# Patient Record
Sex: Female | Born: 1955 | Race: White | Hispanic: No | Marital: Married | Smoking: Former smoker
Health system: Southern US, Academic
[De-identification: ages and names within clinical notes are randomized; demographics above are authoritative.]

## PROBLEM LIST (undated history)

## (undated) DIAGNOSIS — E079 Disorder of thyroid, unspecified: Secondary | ICD-10-CM

## (undated) DIAGNOSIS — C189 Malignant neoplasm of colon, unspecified: Secondary | ICD-10-CM

## (undated) HISTORY — PX: HX COLECTOMY: SHX59

## (undated) HISTORY — PX: HX GALL BLADDER SURGERY/CHOLE: SHX55

## (undated) HISTORY — PX: HX HYSTERECTOMY: SHX81

## (undated) HISTORY — PX: HX TONSILLECTOMY: SHX27

## (undated) HISTORY — PX: LIVER RESECTION: SHX1977

## (undated) HISTORY — PX: HX APPENDECTOMY: SHX54

---

## 2011-08-30 IMAGING — CT CT ABDOMEN AND PELVIS WITH CONTRAST
2 of 9 series · 12 of 46 positions shown, 18 images · IV contrast (CE)
Comparison: none

[place], [place] [id number]                 FAX:  [phone]
CT ABDOMEN AND PELVIS WITH CONTRAST, 08/30/11:
Comparison made to multiple exams dated 03/21/11.

[Series 3: body 2.0 abd portal ce · axial · portal-venous · 0.76mm/px · z∈[+1388,+1778]mm · 10 of 226 slices shown, 15 images]
[im 16/226  soft-tissue]
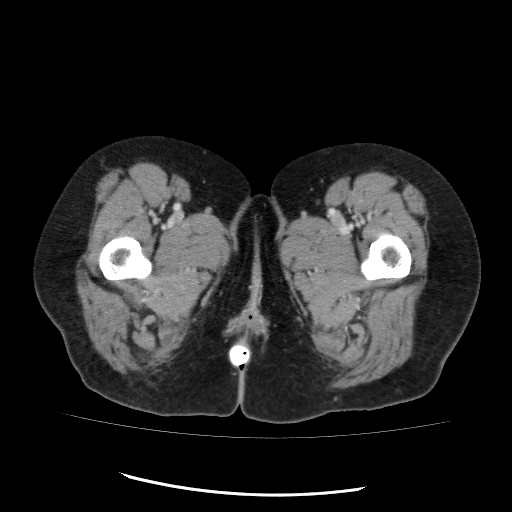
[im 16/226  bone]
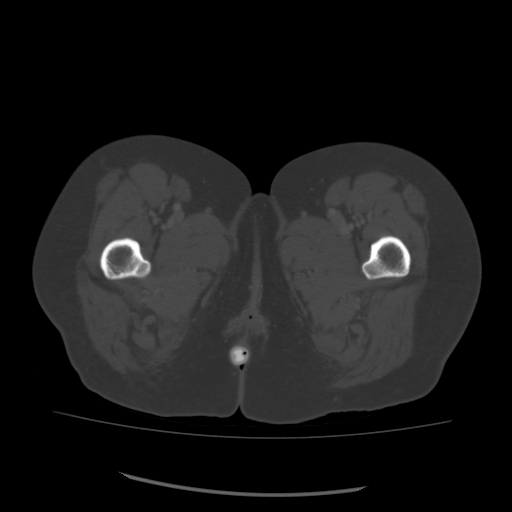
[im 46/226  soft-tissue]
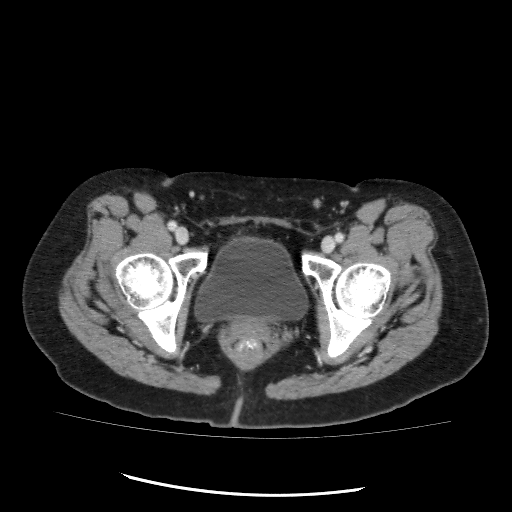
[im 61/226  soft-tissue]
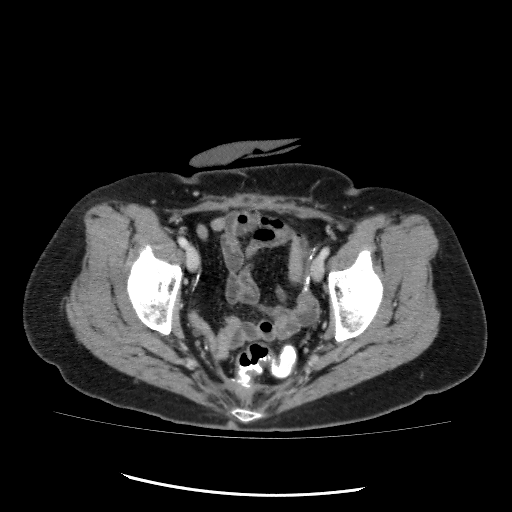
[im 91/226  soft-tissue]
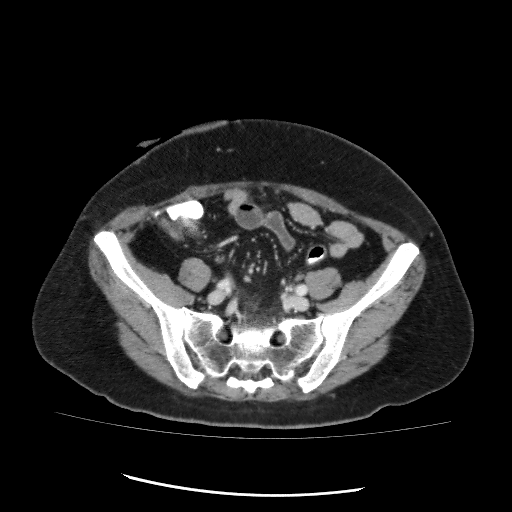
[im 121/226  soft-tissue]
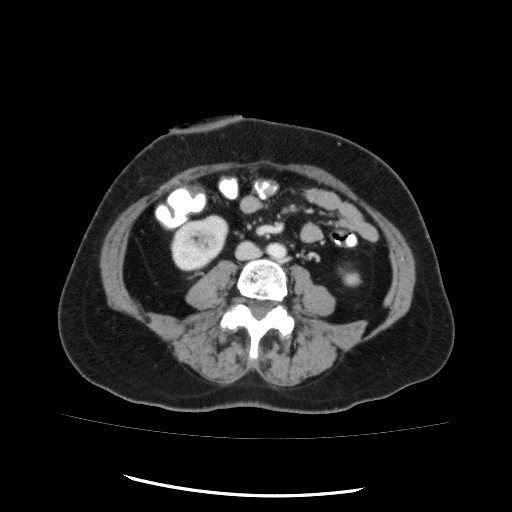
[im 136/226  soft-tissue]
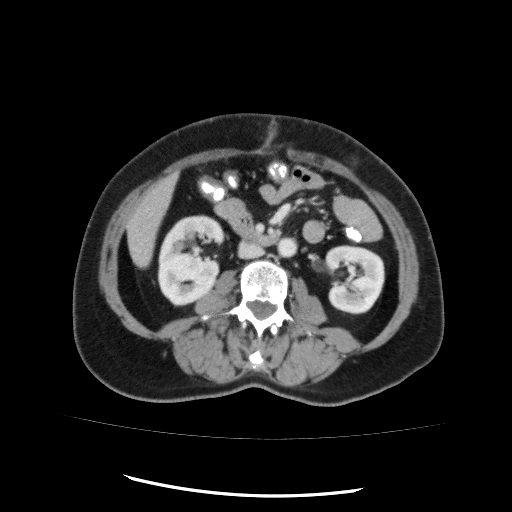
[im 166/226  soft-tissue]
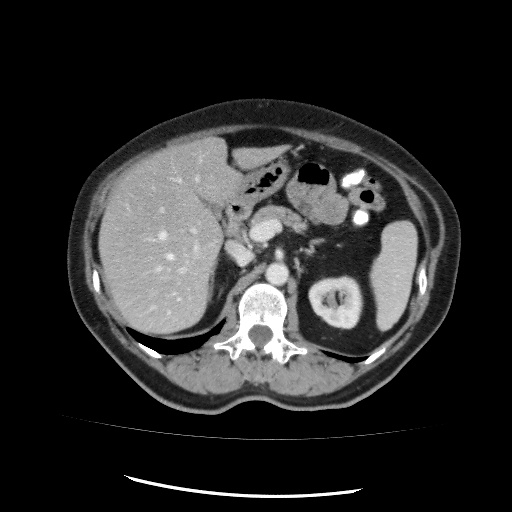
[im 166/226  lung]
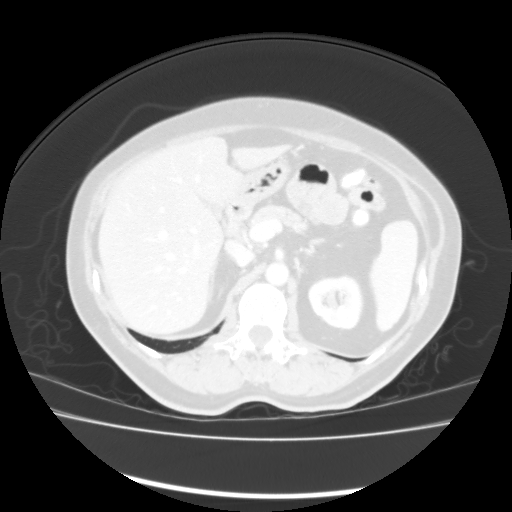
[im 181/226  soft-tissue]
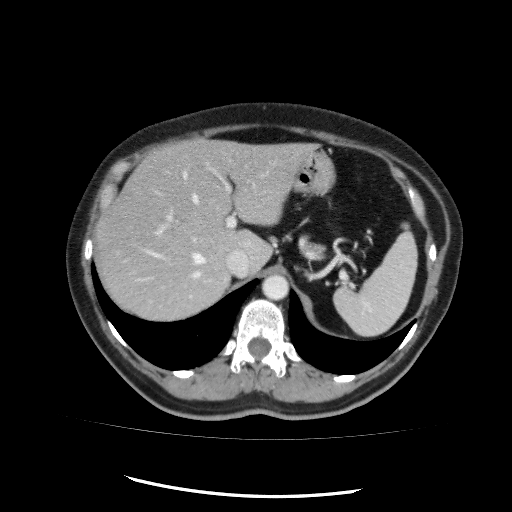
[im 181/226  lung]
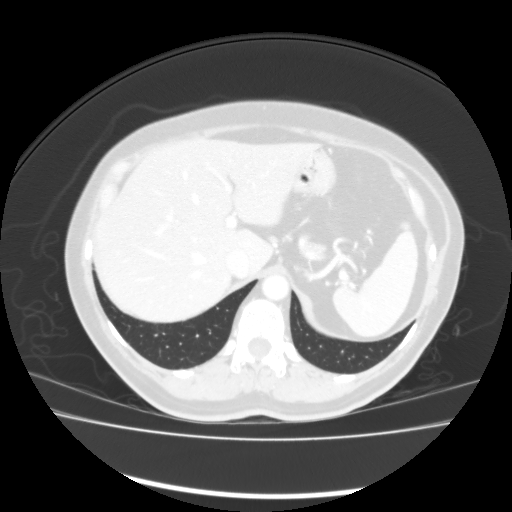
[im 196/226  lung]
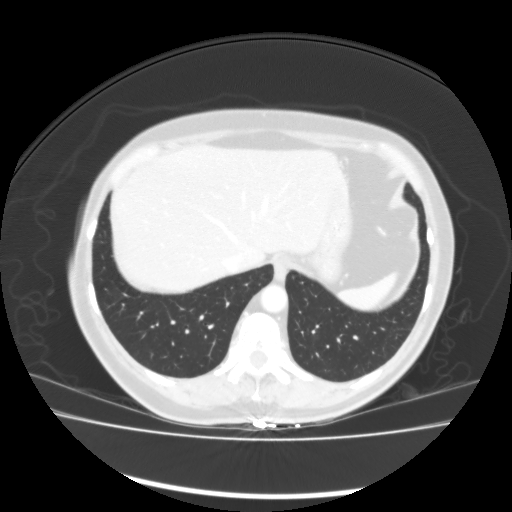
[im 211/226  soft-tissue]
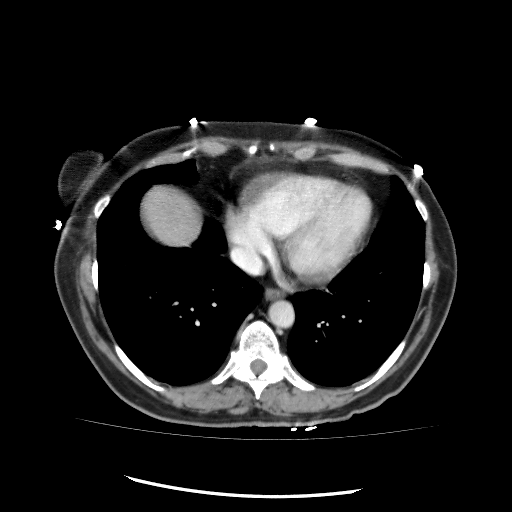
[im 211/226  lung]
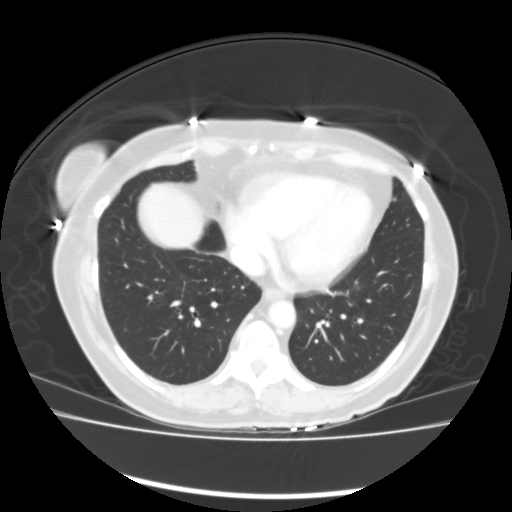
[im 211/226  bone]
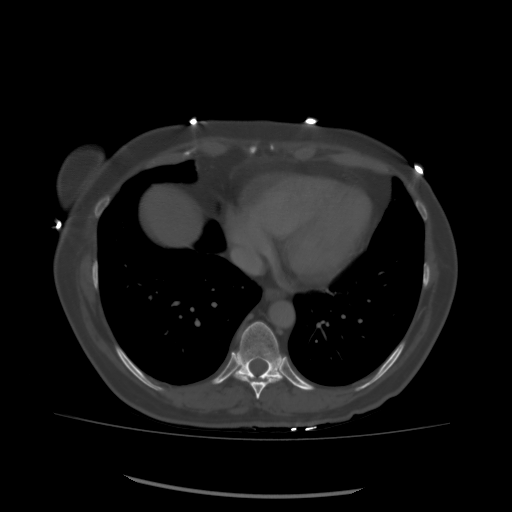

[Series 5: body 3.0 abd portal/coronal ce · coronal · portal-venous · 0.76mm/px · 2 of 89 slices shown, 3 images]
[im 30/89  soft-tissue]
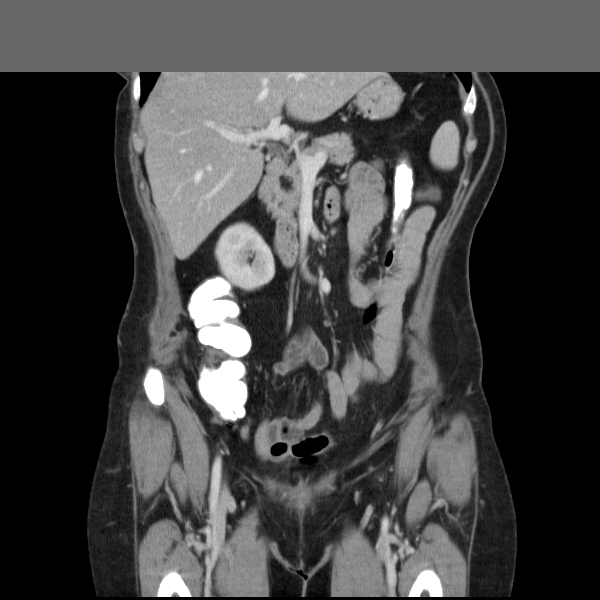
[im 30/89  bone]
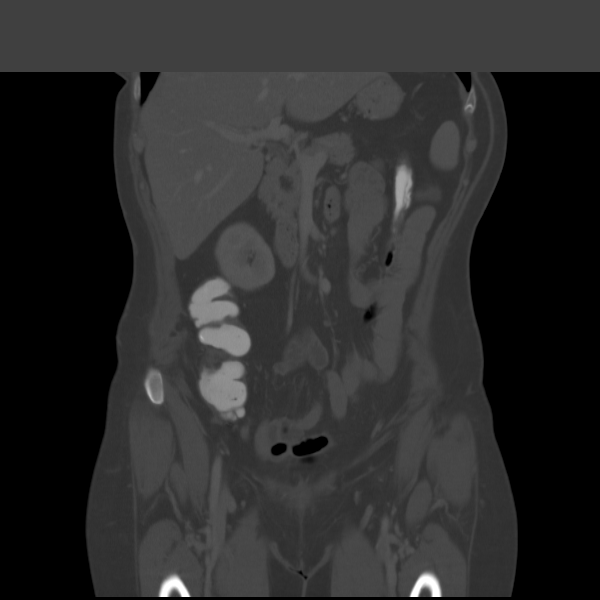
[im 59/89  soft-tissue]
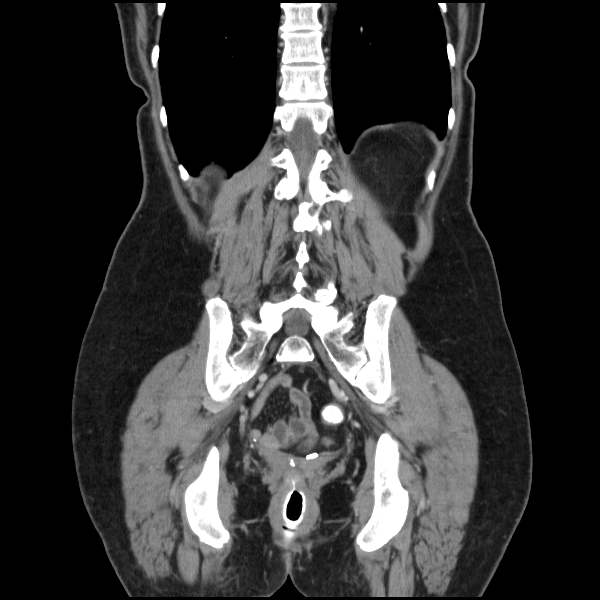

[12 of 46 positions shown; findings below may reference images not displayed]

FINDINGS: Postop changes are seen.  There is an ostomy in the right lower
quadrant.  Contrast was infused into the colon via the rectum.  No evidence
of extravasation or complication seen on this exam.  Postoperative changes
are identified.  The edema previously seen within the presacral space is
once again noted.  However, this is dramatically improved since the prior
exam.  There are no extraluminal fluid collections seen at this time.  The
lung bases are clear.  The liver, spleen, pancreas, adrenals and kidneys
are unremarkable with a nonobstructing left renal calculus seen measuring
approximately 2 x 2 mm.  Otherwise, no acute abnormalities are seen on this
exam.  No recurring abscess seen.
IMPRESSION: 1.   Postop changes.
2.   No recurrent abscess or abnormal fluid collection seen.  The
inflammatory changes and scarring seen in the presacral space has improved
since the examination of 05/14/11.
3.   Nonobstructing left renal calculus.

## 2017-07-01 ENCOUNTER — Emergency Department
Admission: EM | Admit: 2017-07-01 | Discharge: 2017-07-01 | Disposition: A | Discharge status: Home / Self Care | Payer: Medicare Other | Attending: Emergency Medicine | Admitting: Emergency Medicine

## 2017-07-01 ENCOUNTER — Other Ambulatory Visit: Payer: Self-pay

## 2017-07-01 DIAGNOSIS — Z87891 Personal history of nicotine dependence: Secondary | ICD-10-CM | POA: Insufficient documentation

## 2017-07-01 DIAGNOSIS — Z90710 Acquired absence of both cervix and uterus: Secondary | ICD-10-CM | POA: Insufficient documentation

## 2017-07-01 DIAGNOSIS — E039 Hypothyroidism, unspecified: Secondary | ICD-10-CM | POA: Insufficient documentation

## 2017-07-01 DIAGNOSIS — Z85038 Personal history of other malignant neoplasm of large intestine: Secondary | ICD-10-CM | POA: Insufficient documentation

## 2017-07-01 DIAGNOSIS — Z9049 Acquired absence of other specified parts of digestive tract: Secondary | ICD-10-CM | POA: Insufficient documentation

## 2017-07-01 DIAGNOSIS — H6692 Otitis media, unspecified, left ear: Principal | ICD-10-CM | POA: Insufficient documentation

## 2017-07-01 DIAGNOSIS — R51 Headache: Secondary | ICD-10-CM | POA: Insufficient documentation

## 2017-07-01 DIAGNOSIS — Z79899 Other long term (current) drug therapy: Secondary | ICD-10-CM | POA: Insufficient documentation

## 2017-07-01 DIAGNOSIS — R42 Dizziness and giddiness: Secondary | ICD-10-CM | POA: Insufficient documentation

## 2017-07-01 HISTORY — DX: Disorder of thyroid, unspecified: E07.9

## 2017-07-01 HISTORY — DX: Malignant neoplasm of colon, unspecified (CMS HCC): C18.9

## 2017-07-01 LAB — RAPID MOLECULAR INFLUENZA A/B (EAST ONLY): INFLUENZA A RNA: NEGATIVE

## 2017-07-01 MED ORDER — ONDANSETRON 4 MG DISINTEGRATING TABLET
4.0000 mg | ORAL_TABLET | ORAL | Status: AC
Start: 2017-07-01 — End: 2017-07-01
  Administered 2017-07-01: 4 mg via ORAL
  Filled 2017-07-01: qty 1

## 2017-07-01 MED ORDER — AMOXICILLIN 500 MG CAPSULE
500.00 mg | ORAL_CAPSULE | Freq: Three times a day (TID) | ORAL | 0 refills | Status: AC
Start: 2017-07-01 — End: ?

## 2017-07-01 MED ORDER — ONDANSETRON 4 MG DISINTEGRATING TABLET
4.0000 mg | ORAL_TABLET | Freq: Three times a day (TID) | ORAL | 0 refills | Status: AC | PRN
Start: 2017-07-01 — End: ?

## 2017-07-01 NOTE — ED Triage Notes (Signed)
Pt ambulatory to ER reporting frontal h/a and sore throat x 1 day. Pt is also reporting that she is having nausea, L ear pain and lightheadedness Pt is alert and awake , skin warm and dry

## 2017-07-01 NOTE — ED Provider Notes (Signed)
[facility]  Emergency Department     HISTORY OF PRESENT ILLNESS     Date:  07/01/2017  Patient's Name:  Natasha Rodriguez  Date of Birth:  [birth year]      Headache   Associated symptoms: dizziness, ear pain, nausea and sore throat    Associated symptoms: no abdominal pain, no cough, no diarrhea, no fever and no vomiting    Sore Throat   Associated symptoms: ear pain, headaches, nausea and sore throat    Associated symptoms: no abdominal pain, no chest pain, no cough, no diarrhea, no fever and no vomiting    Ear Pain   Associated symptoms: headaches and sore throat    Associated symptoms: no abdominal pain, no cough, no diarrhea, no fever and no vomiting    62 y/o female presents to the ED complaining of sore throat.  Pt is visiting from out of town. Yesterday patient states that she had onset of sore throat. Today patient is experiencing headache, nausea, dizziness, and left ear pain as well. Symptoms have been constant since onset. Denies vomiting or fever. Pt did not get flu shot this year. Pt says she had contact with someone who had the flu recently.     Review of Systems     Review of Systems   Constitutional: Negative for chills and fever.   HENT: Positive for ear pain and sore throat.    Respiratory: Negative for cough.    Cardiovascular: Negative for chest pain.   Gastrointestinal: Positive for nausea. Negative for abdominal pain, constipation, diarrhea and vomiting.   Neurological: Positive for dizziness and headaches. Negative for syncope.   All other systems reviewed and are negative.      Previous History     Past Medical History:  Past Medical History:   Diagnosis Date   . Colon cancer (CMS HCC)    . Thyroid disease     hypothyroidism        Past Surgical History:  Past Surgical History:   Procedure Laterality Date   . Hx appendectomy     . Hx cesarean section     . Hx cholecystectomy     . Hx colectomy     . Hx hysterectomy     . Hx tonsillectomy     . Liver resection               Social History:  Social History     Tobacco Use   . Smoking status: Former Research scientist (life sciences)   . Smokeless tobacco: Never Used   Substance Use Topics   . Alcohol use: Not Currently   . Drug use: Never     Social History     Substance and Sexual Activity   Drug Use Never       Family History:  No family history on file.    Medication History:  Current Outpatient Medications   Medication Sig   . amoxicillin (AMOXIL) 500 mg Oral Capsule Take 1 Cap (500 mg total) by mouth Three times a day   . ondansetron (ZOFRAN ODT) 4 mg Oral Tablet, Rapid Dissolve Take 1 Tab (4 mg total) by mouth Every 8 hours as needed for nausea/vomiting   . thyroid (ARMOUR THYROID) 120 mg Oral Tablet Take 120 mg by mouth Once a day       Allergies:  Allergies   Allergen Reactions   . Adhesive      Rash         Physical  Exam     Vitals:    BP 127/78   Pulse 79   Temp 37 C (98.6 F)   Resp 16   Ht 1.626 m (5\' 4" )   Wt 68 kg (150 lb)   SpO2 96%   BMI 25.75 kg/m         Physical Exam   Nursing note and vitals reviewed.  Constitutional:  Well developed, well nourished.  Awake & alert. No distress.  Head:  Atraumatic.  Normocephalic.    Eyes:  PERRL.  EOMI.  Conjunctivae are not pale.  ENT:  Mucous membranes are moist and intact.  Oropharynx is clear and symmetric.  Patent airway. Post nasal drip. Left TM fullness and erythema.   Neck:  Supple.  Full ROM.  No JVD.  No lymphadenopathy.  Cardiovascular:  Regular rate.  Regular rhythm.  No murmurs, rubs, or gallops.  Distal pulses are 2+ and symmetric.  Pulmonary/Chest:  No evidence of respiratory distress.  Clear to auscultation bilaterally.  No wheezing, rales or rhonchi. Chest non-tender.  Abdominal:  Soft and non-distended.  There is no tenderness.  No rebound, guarding, or rigidity.  No organomegaly.  Good bowel sounds.    Back:  No CVA tenderness. FROM.   Extremities:  No edema.   No cyanosis.  No clubbing.  Full range of motion in all extremities.  No calf tenderness.  Skin:  Skin is warm and  dry.  No diaphoresis. No rash.   Neurological:  Alert, awake, and appropriate.  Normal speech.  Sensation normal. Motor strengths 5/5. CN II-XII intact.   Psychiatric:  Good eye contact.  Normal interaction, affect, and behavior.      Diagnostic Studies/Treatment     Medications:  Medications   ondansetron (ZOFRAN ODT) rapid dissolve tablet (4 mg Oral Given 07/01/17 0952)       This SmartLink is deprecated. Use AVSMEDLIST instead to display the medication list for a patient.    Labs:    Results for orders placed or performed during the hospital encounter of 07/01/17   RAPID MOLECULAR INFLENZA A & B - [facility] ONLY   Result Value Ref Range    INFLUENZA A RNA Negative Negative    INFLUENZA B RNA Negative Negative       Radiology:  None    No orders to display       ECG:  NONE      Procedure     Procedures    Course/Disposition/Plan     Course:    Chart Review:  Chart Review complete.   Initial Evaluation:  0940: Initial evaluation is complete at this time. I discussed with the patient that I would order a flu test to further evaluate. Zofran ordered to treat nausea. I will reevaluate to check the patient's progress after treatment. Patient is agreeable with the treatment plan at this time.  Reevaluation Notes:  9528: On reevaluation updated patient on results of negative flu test. Informed her that I would prescribe an antibiotic to treat her ear infection. Patient verbalized understanding.    Based on my history, physical exam, and diagnostic evaluation, the patient appears to have symptoms consistent with acute otitis media. There is no mastoid tenderness or evidence of spreading infection. There was no trauma to the ear. Pt is tolerating oral food and fluid. Pt is to follow up and have repeat exam with their primary care physician as soon as possible and instructed to return to the emergency department for new complaints  such as severe headache, increased ear pain, difficulty breathing or not tolerating oral  food/fluid.    Patient discharged with prescription for amoxicillin.       Disposition:    Discharged    Condition at Disposition:   Stable      Follow up:   Pcp, No    In 1 week        Clinical Impression:     Encounter Diagnosis   Name Primary?   . Left otitis media, unspecified otitis media type Yes       Future Appointments Scheduled in Epic:  No future appointments.  SCRIBE ATTESTATION   This note is prepared by Gerhard Perches, acting as Scribe for Dr. Polly Cobia    The scribe's documentation has been prepared under my direction and personally reviewed by me in its entirety.  I confirm that the note above accurately reflects all work, treatment, procedures, and medical decision making performed by me, Dr. Polly Cobia

## 2017-07-01 NOTE — ED Nurses Note (Signed)
Care completed and report to [facility]

## 2017-07-01 NOTE — ED Nurses Note (Signed)
Patient discharged home with family.  AVS reviewed with patient/care giver.  A written copy of the AVS and discharge instructions was given to the patient/care giver.  Questions sufficiently answered as needed.  Patient/care giver encouraged to follow up with PCP as indicated.  In the event of an emergency, patient/care giver instructed to call 911 or go to the nearest emergency room.     RX sent electronically

## 2017-07-01 NOTE — ED Nurses Note (Signed)
MD at bedside to assess.

## 2019-01-29 IMAGING — MR MRI ABDOMEN W/WO CONTRAST
16 of 22 series · 30 of 48 positions shown · IV contrast (gadolinium)
Comparison: CT exam the same day and dating back to 09/07/2010. Report only of MR 
exam of 06/07/2018.

MRI ABDOMEN W/WO CONTRAST, 01/29/2019 [DATE]: 
CLINICAL INDICATION: Colorectal cancer. Metastases to liver 3 years ago. Liver 
resection.
TECHNIQUE: Multiplanar, multi sequence images were performed of the abdomen with 
and with gadolinium contrast material. 7.5 cc of Gadavist were administered per 
protocol. The patient's eGFR was calculated to be 67 using the i-STAT device.

[Series 101: survey · axial · 15.0mm · 1.76mm/px · 1 of 11 slices shown]
[im 1/11]
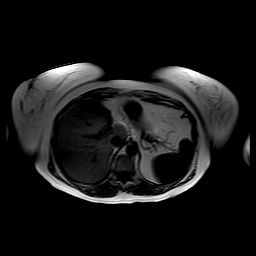

[Series 201: survey navi · axial · 15.0mm · 1.76mm/px · 1 of 11 slices shown]
[im 1/11]
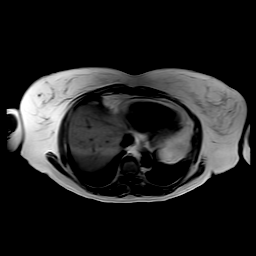

[Series 302: sout of phase · axial · 6.0mm · 1.19mm/px · 1 of 32 slices shown]
[im 1/32]
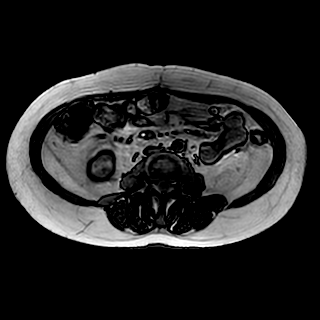

[Series 303: sin phase · axial · 6.0mm · 1.19mm/px · 1 of 32 slices shown]
[im 1/32]
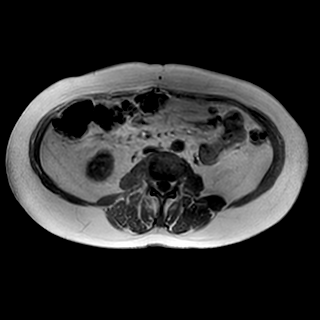

[Series 401: (id)* · coronal · 5.0mm · 0.72mm/px · 1 of 34 slices shown]
[im 1/34]
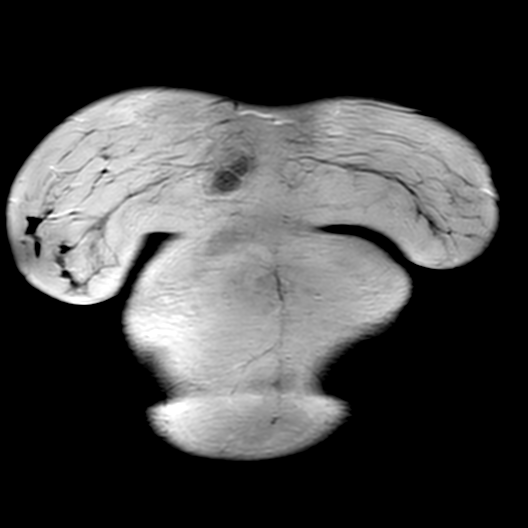

[Series 501: dwi_3b_nav · axial · 5.0mm · 1.56mm/px · 1 of 90 slices shown]
[im 1/90]
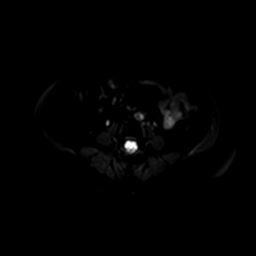

[Series 502: dadc 600 · axial · 5.0mm · 1.56mm/px · 1 of 45 slices shown]
[im 1/45]
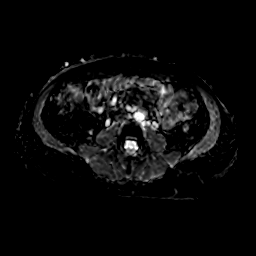

[Series 503: sb0 · axial · 5.0mm · 1.56mm/px · 1 of 45 slices shown]
[im 1/45]
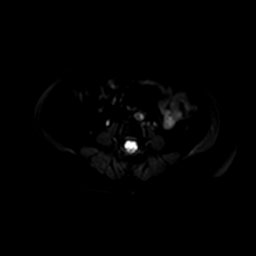

[Series 504: (id) · axial · 5.0mm · 1.56mm/px · 1 of 45 slices shown]
[im 1/45]
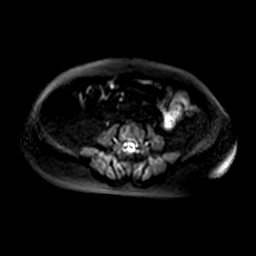

[Series 601: DIXON · axial · 3.5mm · 0.88mm/px · z∈[-146,+71]mm · 8 of 625 slices shown (1 of 6)]
[im 1/625]
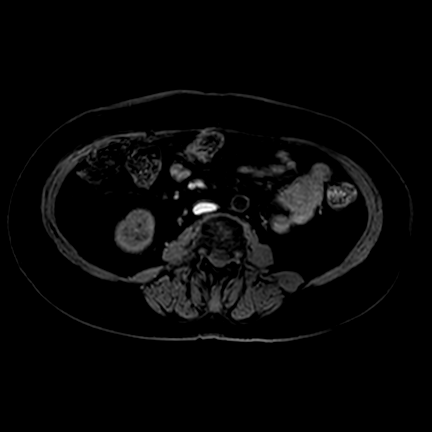
[im 114/625]
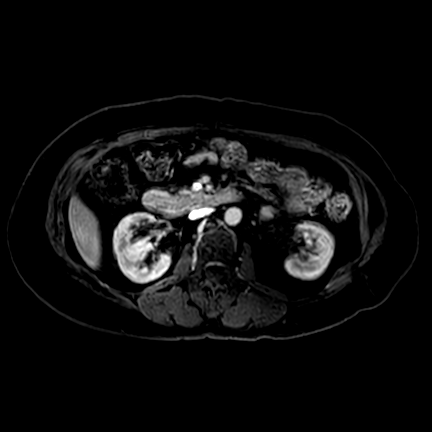
[im 171/625]
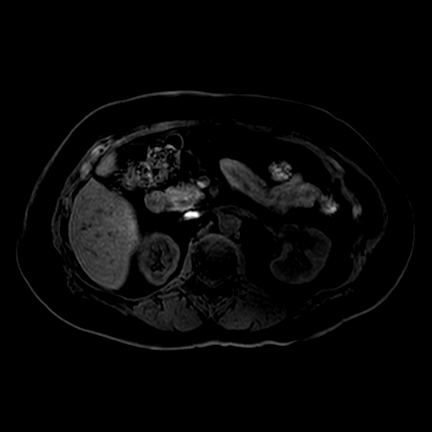
[im 284/625]
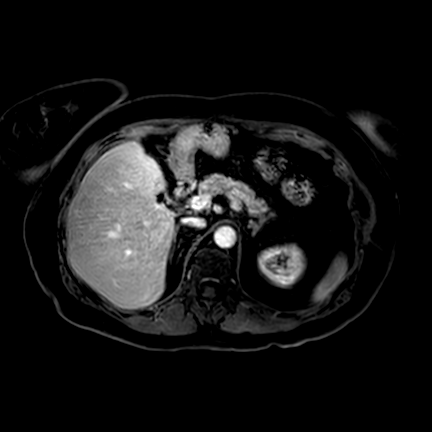
[im 341/625]
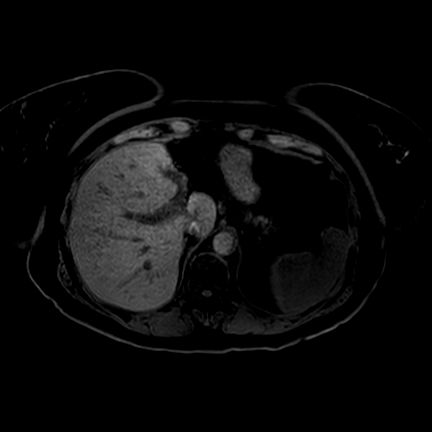
[im 454/625]
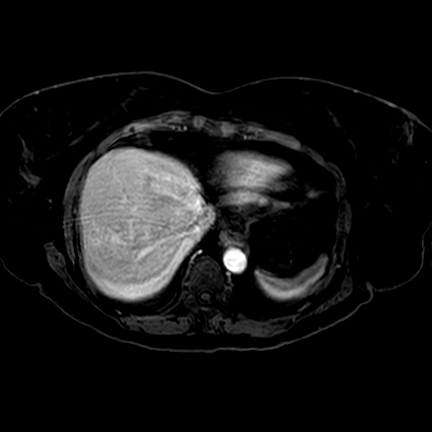
[im 511/625]
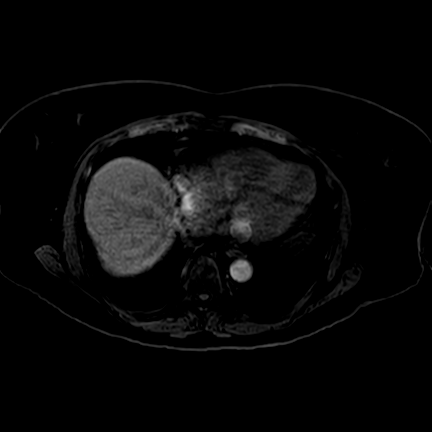
[im 625/625]
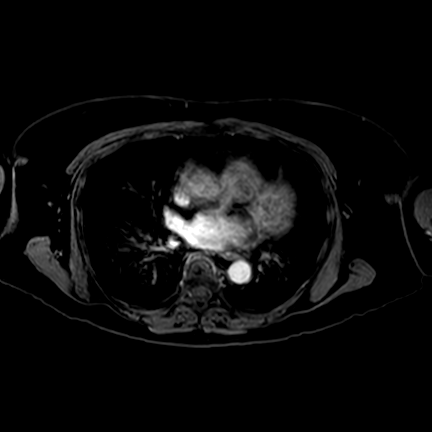

[Series 602: DIXON · axial · 3.5mm · 0.88mm/px · z∈[-146,+71]mm · 2 of 125 slices shown (2 of 6)]
[im 1/125]
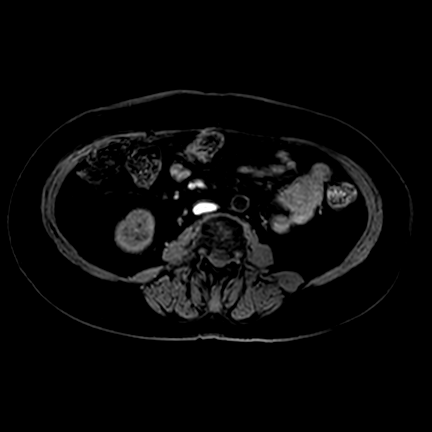
[im 125/125]
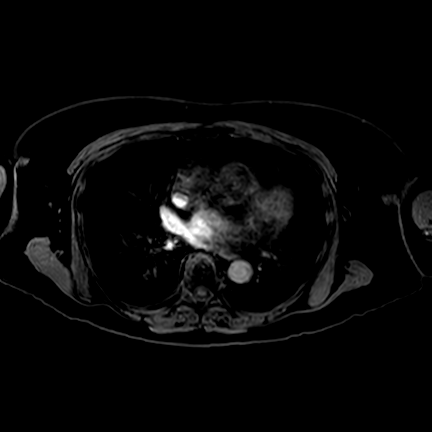

[Series 603: DIXON · axial · 3.5mm · 0.88mm/px · z∈[-146,+71]mm · 2 of 125 slices shown (3 of 6)]
[im 1/125]
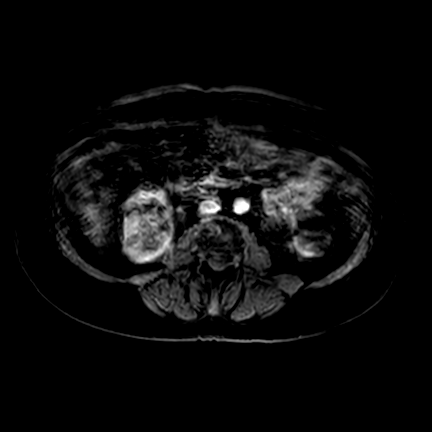
[im 125/125]
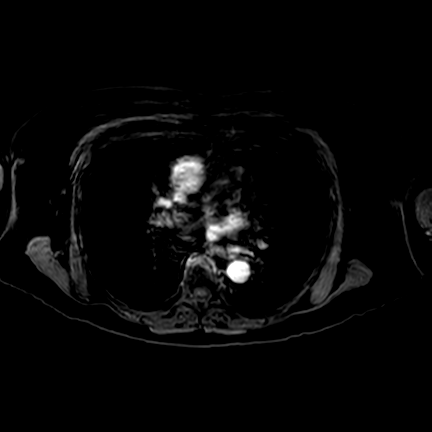

[Series 604: DIXON · axial · 3.5mm · 0.88mm/px · z∈[-146,+71]mm · 2 of 125 slices shown (4 of 6)]
[im 1/125]
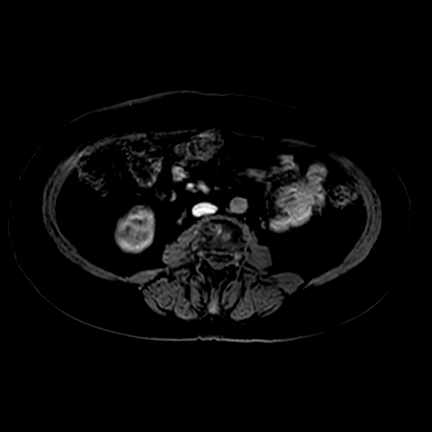
[im 125/125]
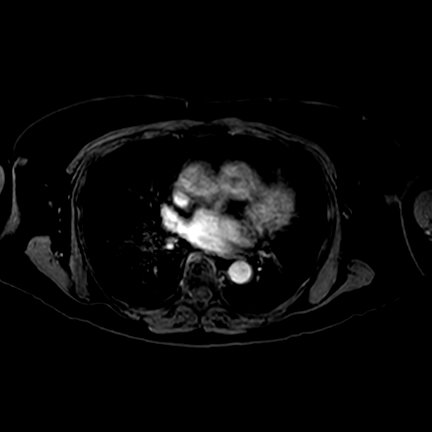

[Series 605: DIXON · axial · 3.5mm · 0.88mm/px · z∈[-146,+71]mm · 2 of 125 slices shown (5 of 6)]
[im 1/125]
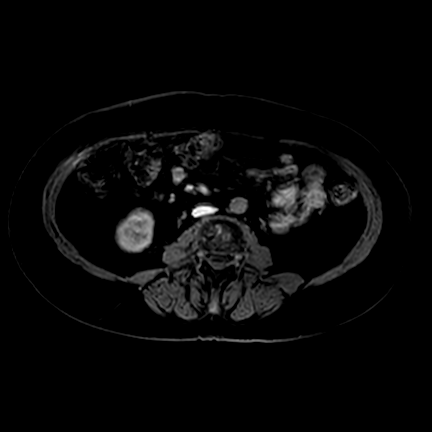
[im 125/125]
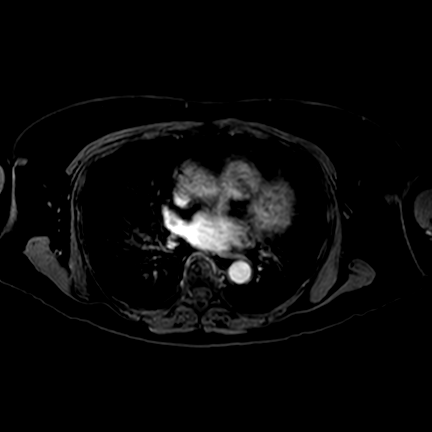

[Series 606: DIXON · axial · 3.5mm · 0.88mm/px · z∈[-146,+71]mm · 2 of 125 slices shown (6 of 6)]
[im 1/125]
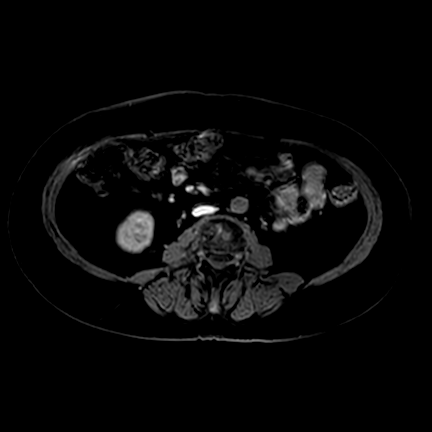
[im 125/125]
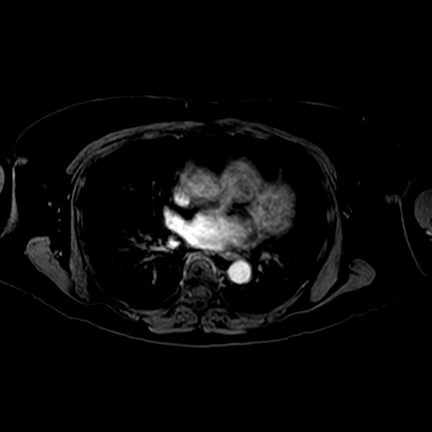

[Series 607: DIXON post-contrast · axial · 3.5mm · 0.88mm/px · z∈[-146,+71]mm · 3 of 125 slices shown]
[im 1/125]
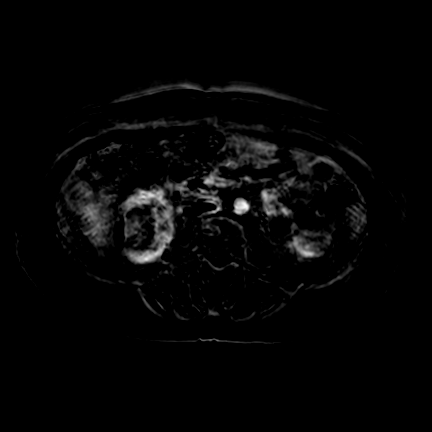
[im 63/125]
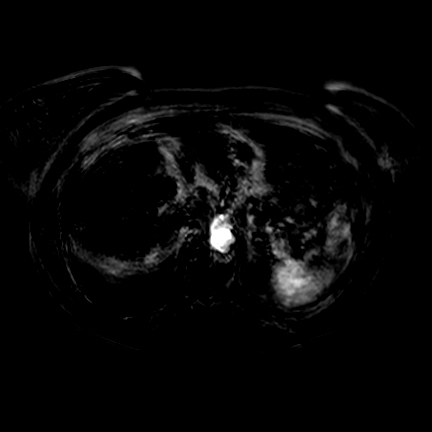
[im 125/125]
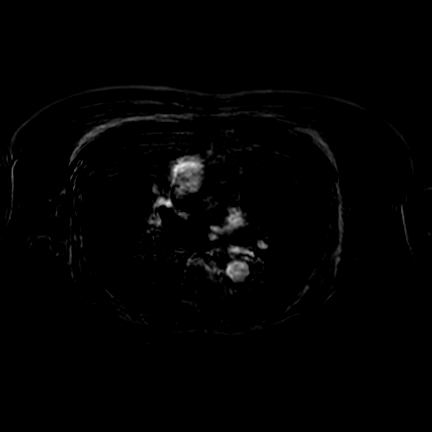

[30 of 48 positions shown; findings below may reference images not displayed]

FINDINGS: Old postsurgical changes of left lateral segmentectomy. Mild signal dropout of 
the liver compatible with hepatic steatosis on the out of phase series. No area 
of abnormal signal intensity within the liver to suggest residual/recurrent 
disease. No area of abnormal restricted diffusion or enhancement. Patient motion 
obscures some detail, particularly on the post gadolinium enhanced series. 
There is a focal area of fat signal intensity on all pulse sequences within the 
pancreatic head measuring 1.7 cm compatible with a lipoma. There is a lipoma 
within the left lateral wall musculature measuring 8.9 cm AP by 2.3 cm TR. Lower 
abdominal wall ventral hernia without bowel incarceration. The gallbladder is 
surgically absent. The pancreas, spleen, adrenal glands, kidneys and bowel are 
negative.
IMPRESSION: 1.  Hepatic steatosis. 
2.  No evidence of neoplasm recurrence or metastatic disease.

## 2019-01-29 IMAGING — CT CT CHEST/ABDOMEN/PELVIS WITH CONTRAST
2 of 7 series · 13 of 46 positions shown, 18 images · IV contrast (ISOVUE 300)
Comparison: There are no prior exam(s) available for comparison within the 
past 12 months;

CT CHEST/ABDOMEN/PELVIS WITH CONTRAST, 01/29/2019 [DATE]: 
CLINICAL INDICATION: Colon carcinoma. Six-month follow-up. Previous appendectomy 
and hysterectomy. Ileostomy. History of liver resection. 
A search for DICOM formatted images was conducted for prior CT imaging studies 
completed at a non-affiliated media free facility.
TECHNIQUE: The chest, abdomen and pelvis were scanned from base of neck through 
the pubic rami with 600cc of Isovue 300 injected intravenously on a high 
resolution low dose CT scanner.  Routine MPR and MIP 3D renderings were 
reconstructed on an independent workstation with concurrent physician 
supervision.

[Series 7: coronal · coronal · 0.62mm/px · 3 of 121 slices shown]
[im 31/121  soft-tissue]
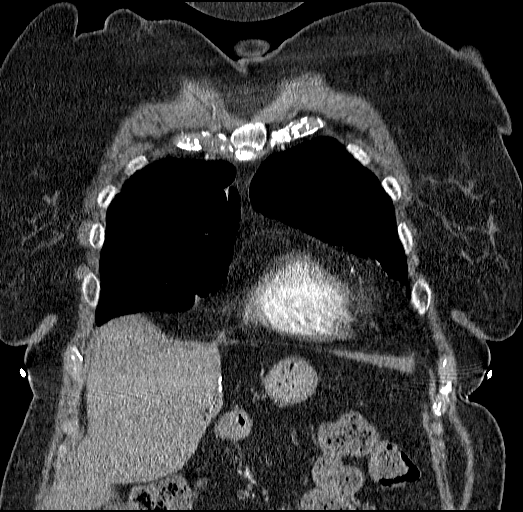
[im 61/121  soft-tissue]
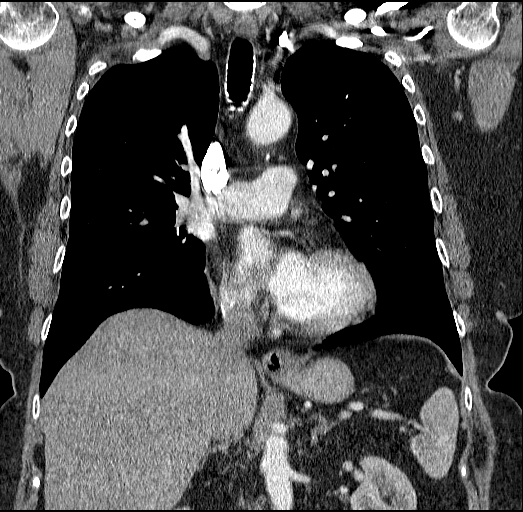
[im 91/121  soft-tissue]
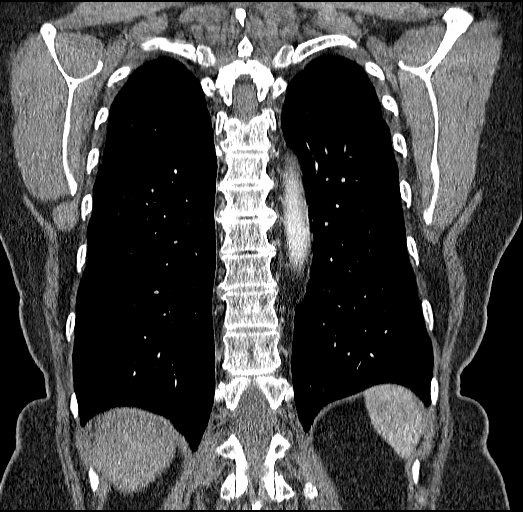

[Series 9: abd/pel with 3.0 i41s 2 · axial · 0.75mm/px · z∈[-552,-170]mm · 10 of 151 slices shown, 15 images]
[im 12/151  soft-tissue]
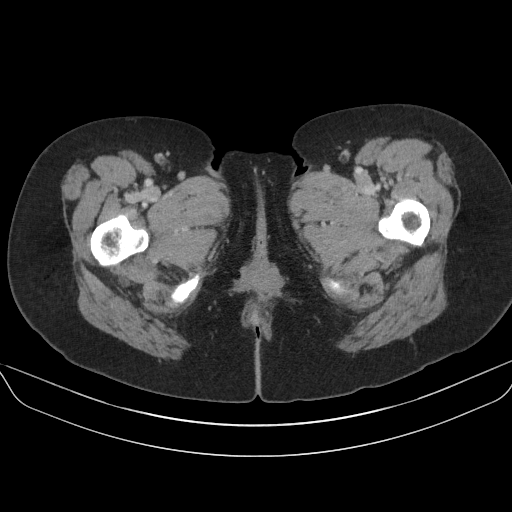
[im 12/151  bone]
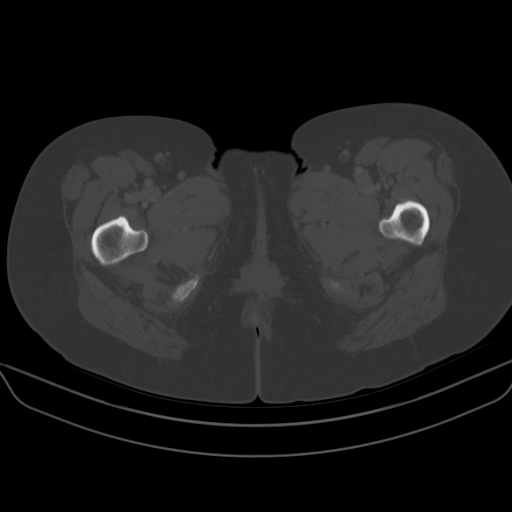
[im 35/151  soft-tissue]
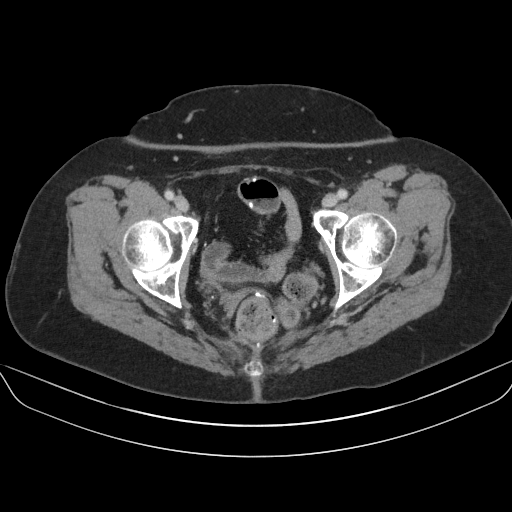
[im 47/151  soft-tissue]
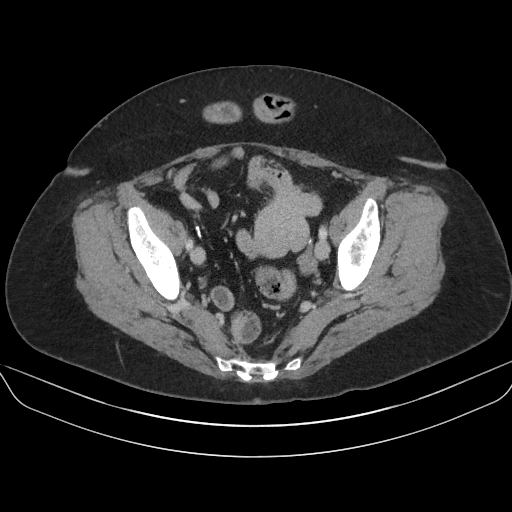
[im 58/151  soft-tissue]
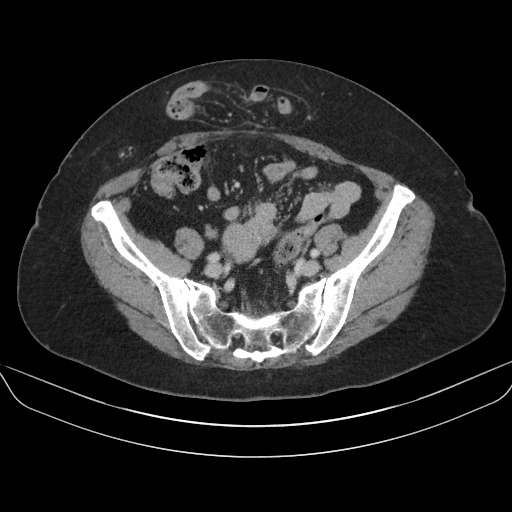
[im 81/151  soft-tissue]
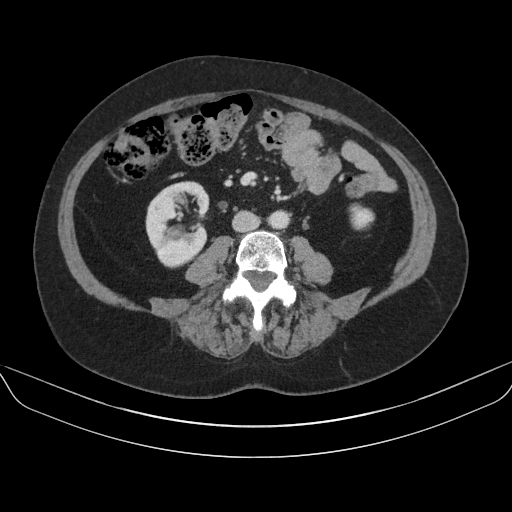
[im 93/151  soft-tissue]
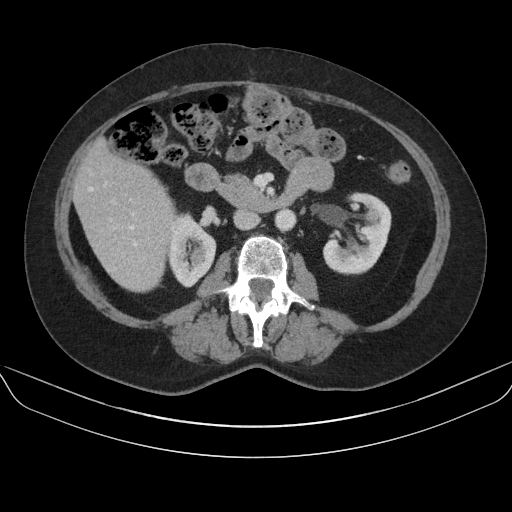
[im 104/151  soft-tissue]
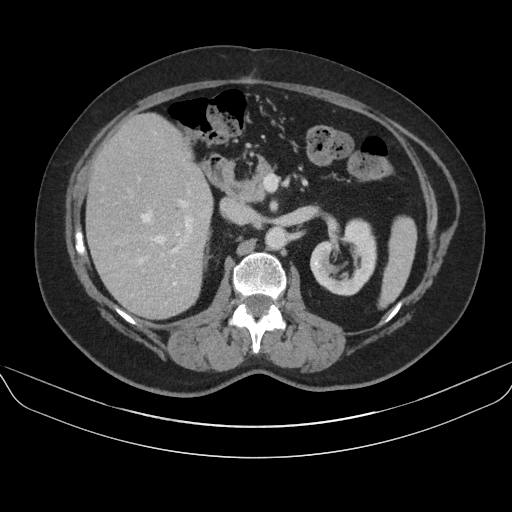
[im 104/151  lung]
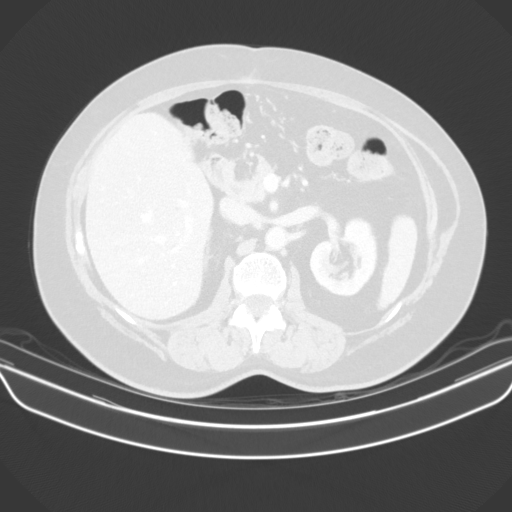
[im 116/151  lung]
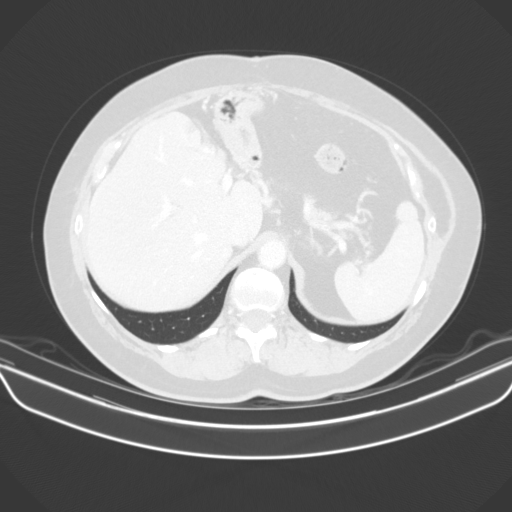
[im 127/151  soft-tissue]
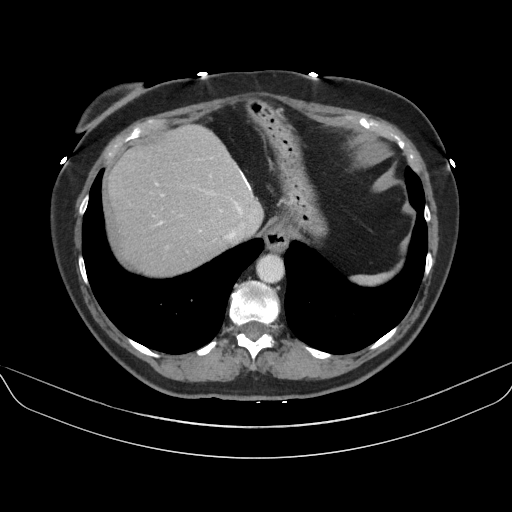
[im 127/151  lung]
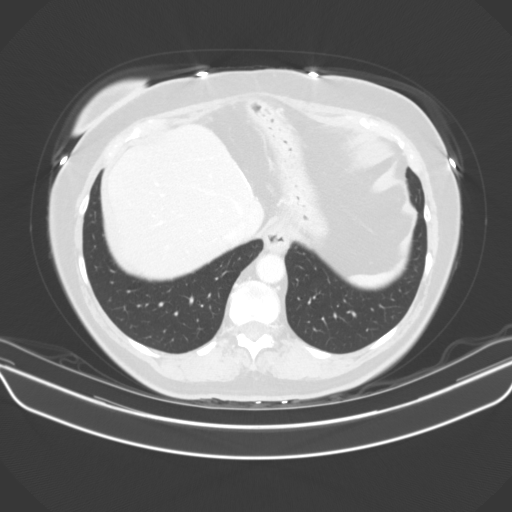
[im 139/151  soft-tissue]
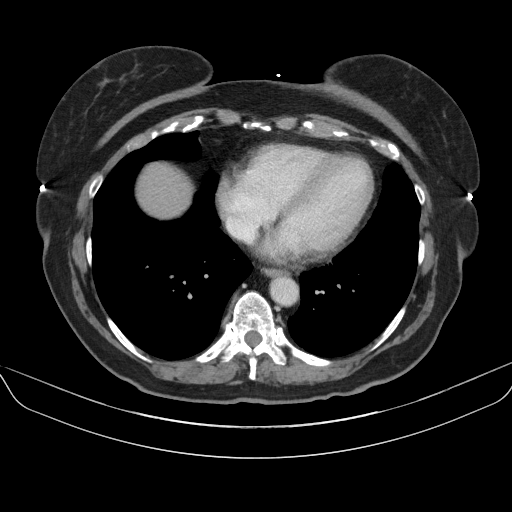
[im 139/151  lung]
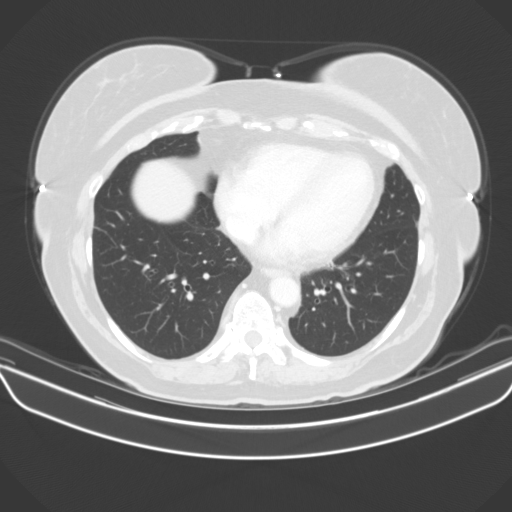
[im 139/151  bone]
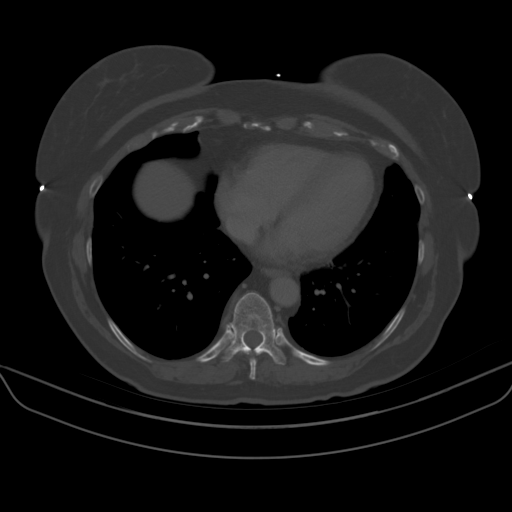

[13 of 46 positions shown; findings below may reference images not displayed]

however, comparison was made to the prior exam(s) dated  6809 CT 
of the abdomen pelvis
FINDINGS: There is a report from February 17, 2017 which describes no evidence of 
metastatic disease. I do not have that exam for comparison. 
There appears to be chronic inflammatory changes medial segment right middle 
lobe with mild bronchiectasis and atelectasis. Mild linear atelectasis in the 
right lower lobe as well. There is no mass identified. No pleural effusion. No 
adenopathy identified. Atherosclerotic changes without significant coronary 
calcifications. Degenerative changes. 
Degenerative changes and postop changes. Anterior abdominal wall hernia 
containing nonobstructing loops of small bowel just below the umbilicus. Postop 
changes with at least partial left hepatectomy. The residual portions of the 
liver do not reveal suspicious mass. Pancreas, spleen, adrenal glands and 
kidneys are normal in appearance with a 7 mm nonobstructing calculus inferior 
pole of the left kidney. Average Hounsfield units 550. Postop changes seen in 
the colon. No obstructive changes. No adenopathy. Bladder is decompressed.
IMPRESSION: Postop changes. No evidence of recurrent malignancy. 
Anterior abdominal wall hernia containing loops of small bowel nonobstructing 
just below the level the umbilicus. 
Atherosclerotic changes and degenerative changes. 
What appears to most likely be chronic inflammation medial segment right middle 
lobe. 
Nonobstructing left renal calculus. 
RADIATION DOSE REDUCTION: All CT scans are performed using radiation dose 
reduction techniques, when applicable.  Technical factors are evaluated and 
adjusted to ensure appropriate moderation of exposure.  Automated dose 
management technology is applied to adjust the radiation doses to minimize 
exposure while achieving diagnostic quality images.

## 2019-02-01 IMAGING — MG MAMMOGRAPHY SCREENING BILATERAL 3D TOMOSYNTHESIS WITH CAD
8 series · 8 of 24 positions shown · non-contrast
Comparison: Comparison was made to prior exams. 
BREAST DENSITY: (Level B) There are scattered areas of fibroglandular density.

MAMMOGRAPHY SCREENING BILATERAL 3D TOMOSYNTHESIS WITH CAD, 02/01/2019 [DATE]: 
CLINICAL INDICATION: Screening exam.
TECHNIQUE: Digital bilateral mammograms and 3-D Tomosynthesis were obtained. 
These were interpreted both primarily and with the aid of computer-aided 
detection system.

[R MLO]
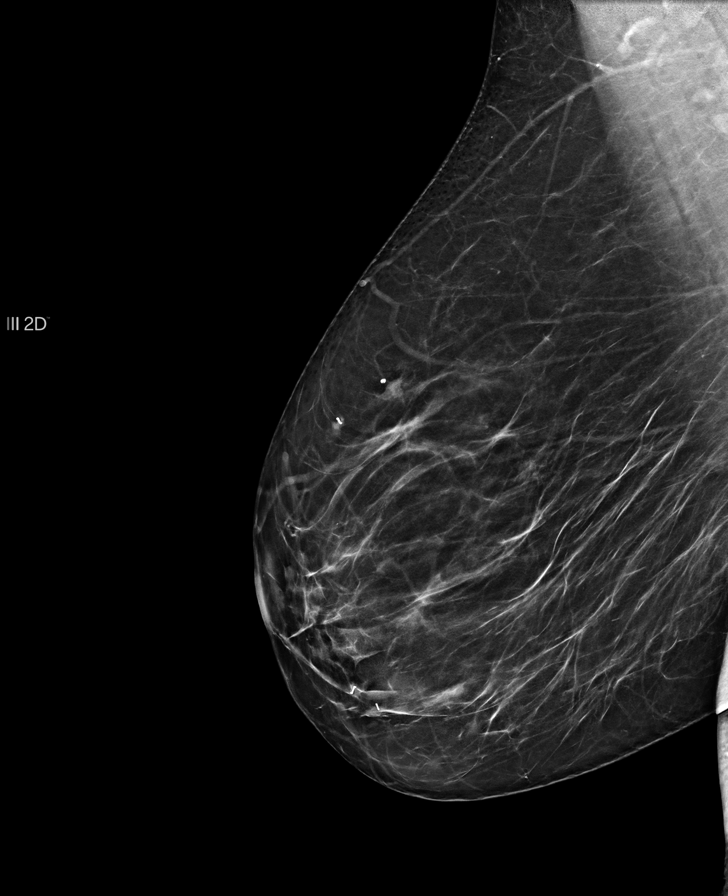

[L CC]
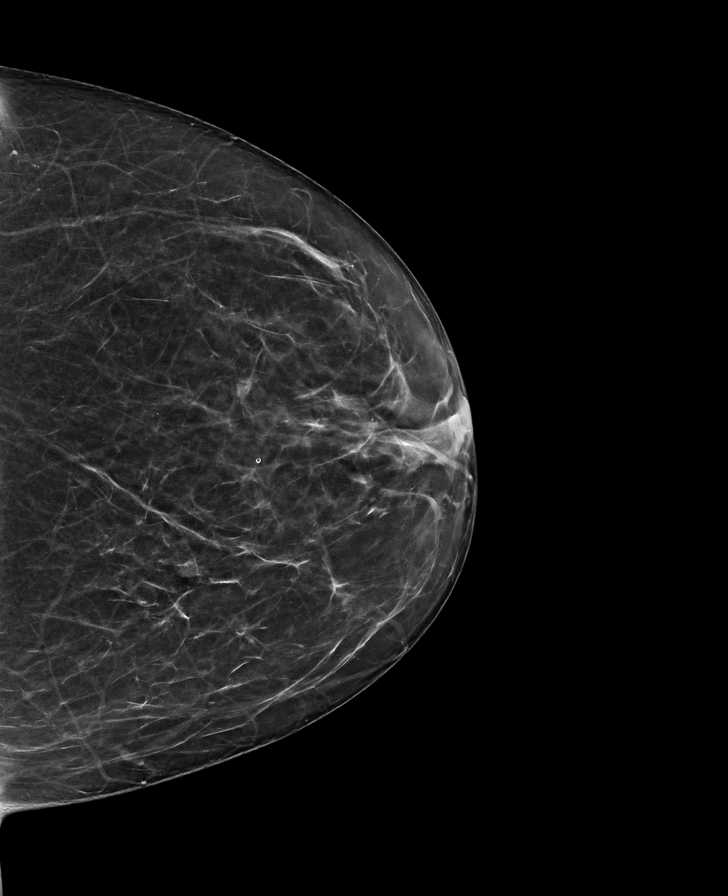

[L MLO]
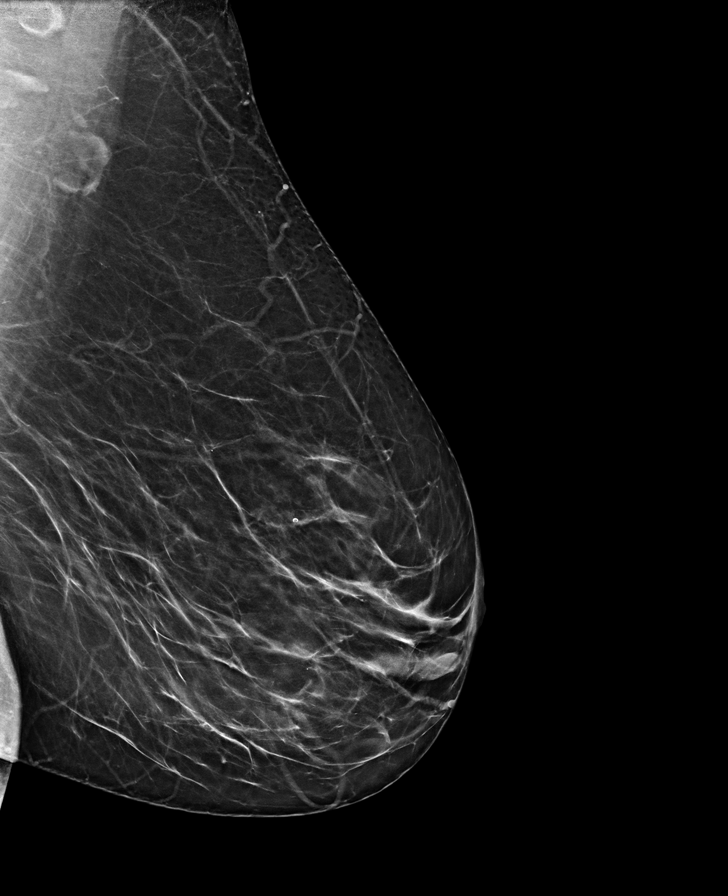

[R CC]
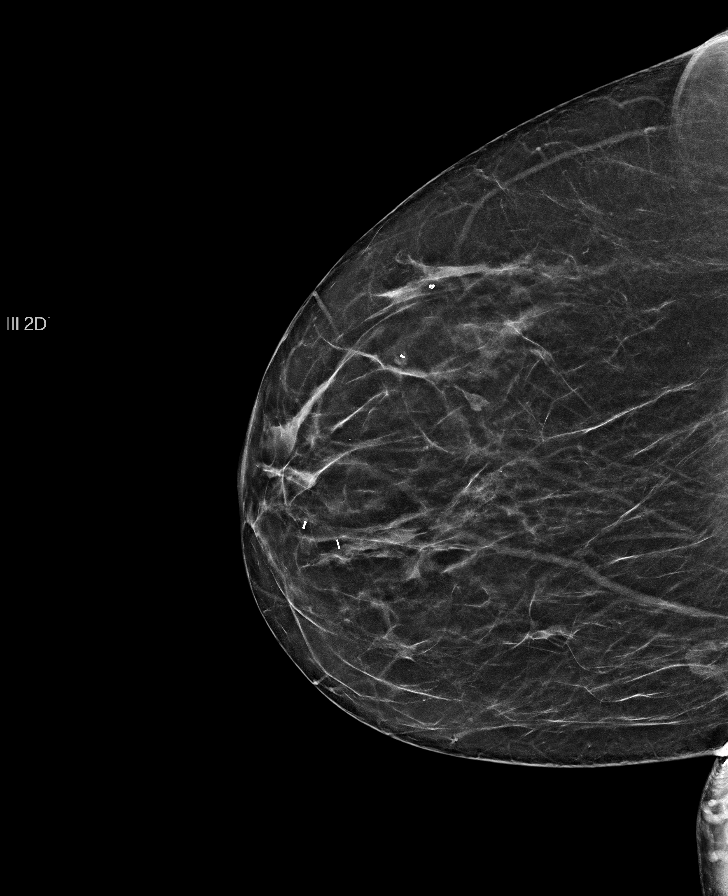

[L CC tomo · tomo slice 31/61.0]
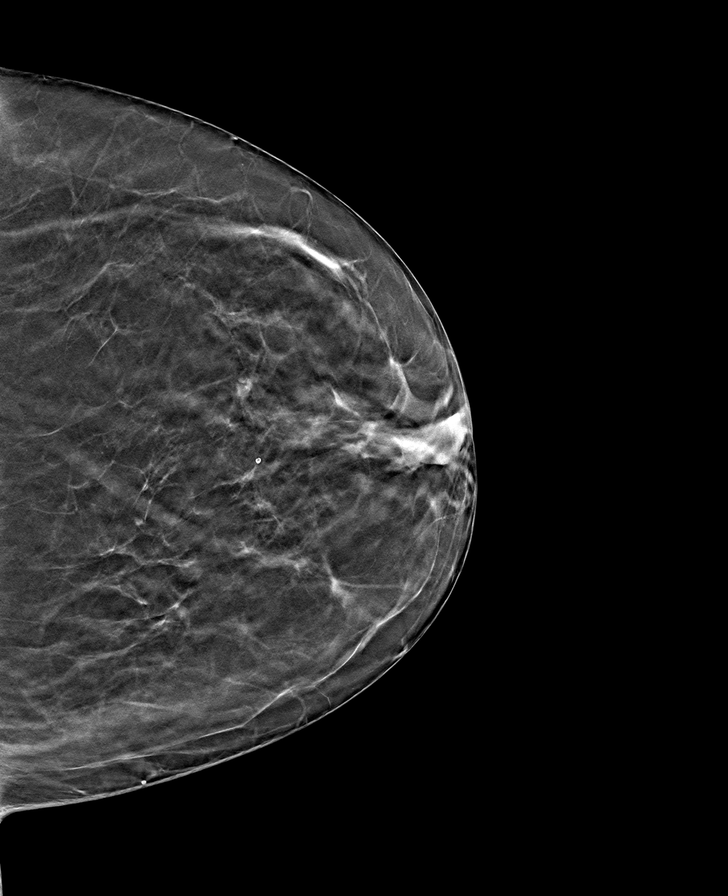

[R CC tomo · tomo slice 31/60.0]
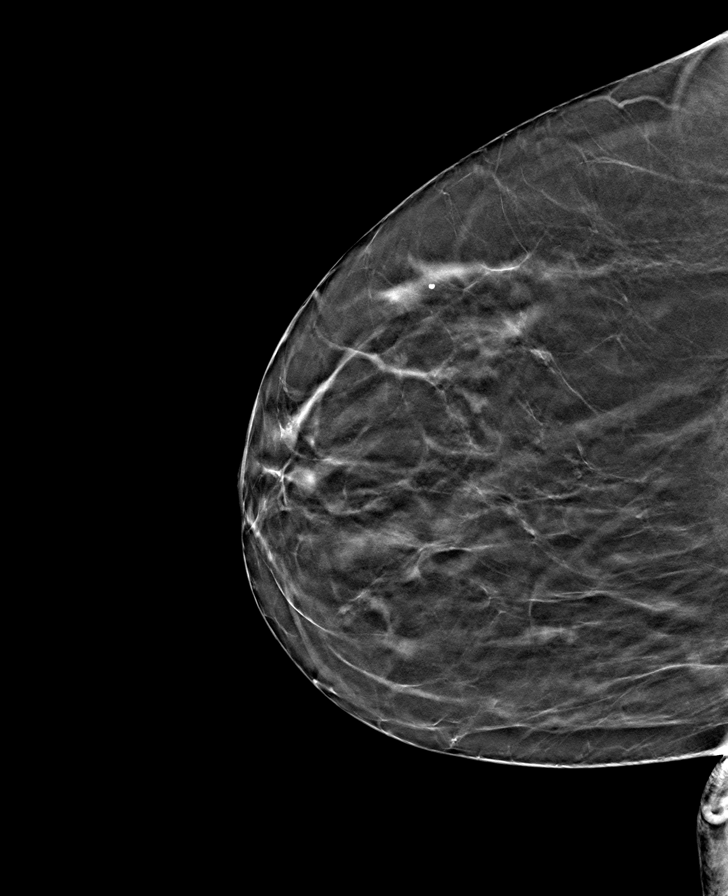

[R MLO tomo · tomo slice 35/68.0]
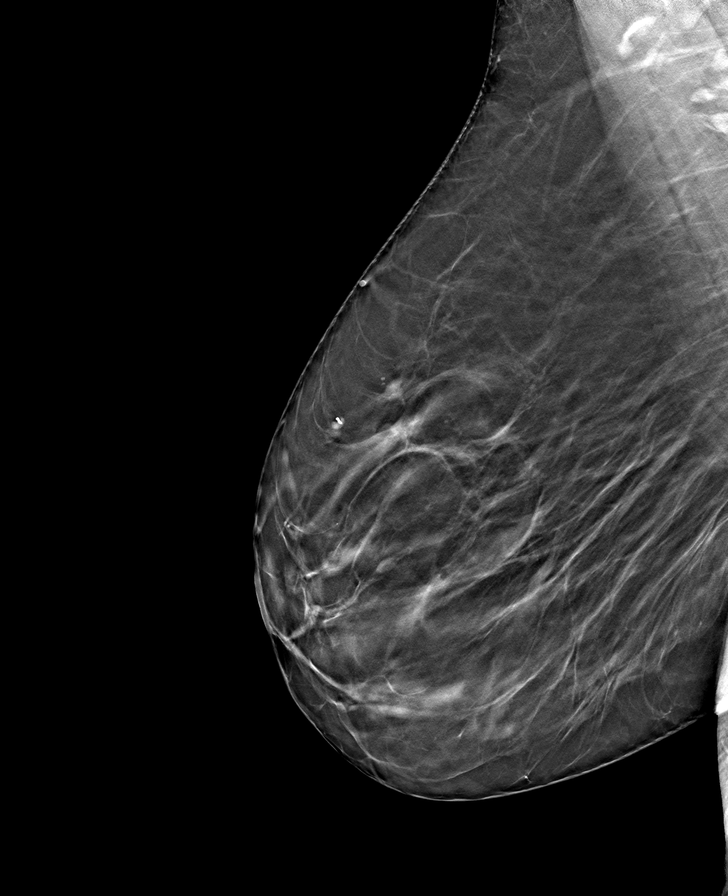

[L MLO tomo · tomo slice 36/71.0]
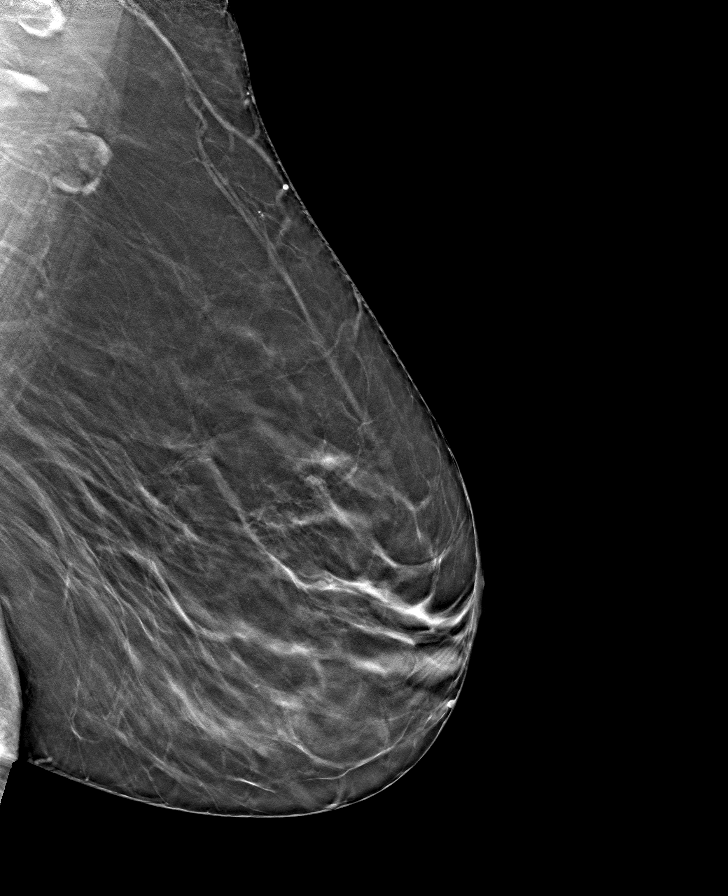

[8 of 24 positions shown; findings below may reference images not displayed]

FINDINGS: There is been interval placement of 3 post procedure clips in the 
right breast compared to exam from 0920.No suspicious mass, calcifications, or 
area of architectural distortion in either breast.
IMPRESSION: No mammographic evidence of malignancy in either breast. 
( BI-RADS 2) Benign findings. Routine mammographic follow-up is recommended.

## 2019-08-10 IMAGING — CT CT CHEST/ABDOMEN/PELVIS WITH CONTRAST
2 of 6 series · 15 of 46 positions shown, 17 images · IV contrast (ISOVUE 300)
Comparison: 

CT CHEST/ABDOMEN/PELVIS WITH CONTRAST, 08/10/2019 [DATE]: 
CLINICAL INDICATION:  Colon cancer with resection and ileostomy. Partial hepatic 
resection. Status post chemotherapy. 
A search for DICOM formatted images was conducted for prior CT imaging studies 
completed at a non-affiliated media free facility.
TECHNIQUE: The chest, abdomen and pelvis were scanned with 100 cc of Isovue 300 
injected intravenously on a high-resolution CT scanner using dose reduction 
techniques.  Routine MPR reconstructions were performed.

[Series 7: coronal · coronal · 0.63mm/px · 3 of 130 slices shown]
[im 33/130  soft-tissue]
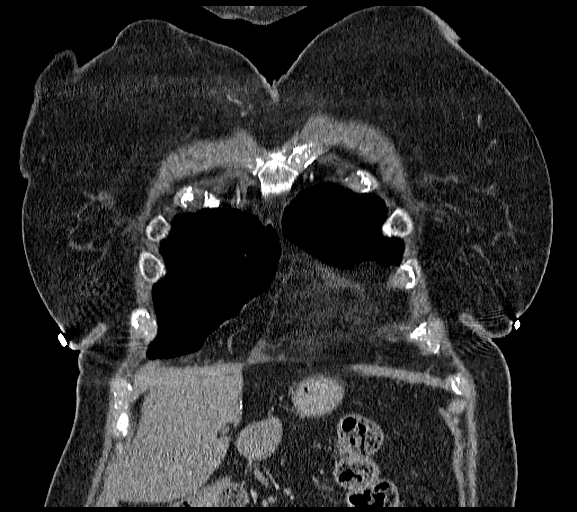
[im 65/130  soft-tissue]
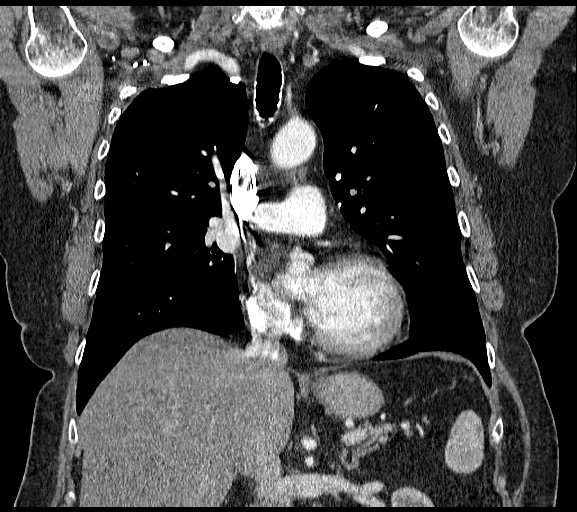
[im 97/130  soft-tissue]
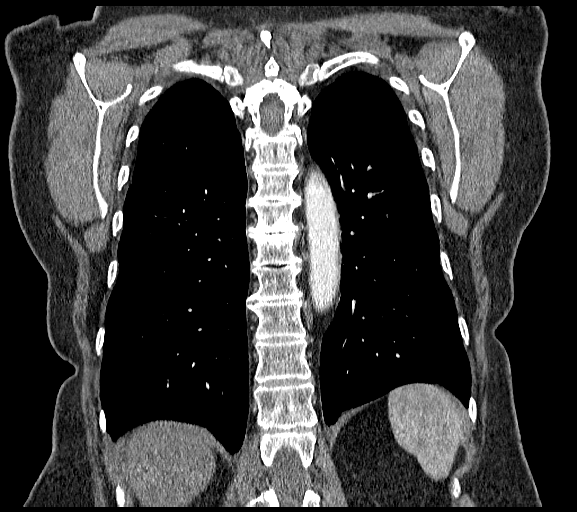

[Series 9: abd/pel with 3.0 i41s 2 · axial · 0.82mm/px · z∈[-605,-206]mm · 12 of 155 slices shown, 14 images]
[im 11/155  soft-tissue]
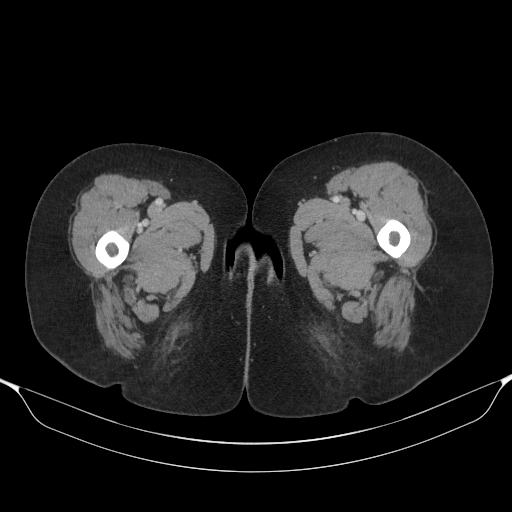
[im 11/155  bone]
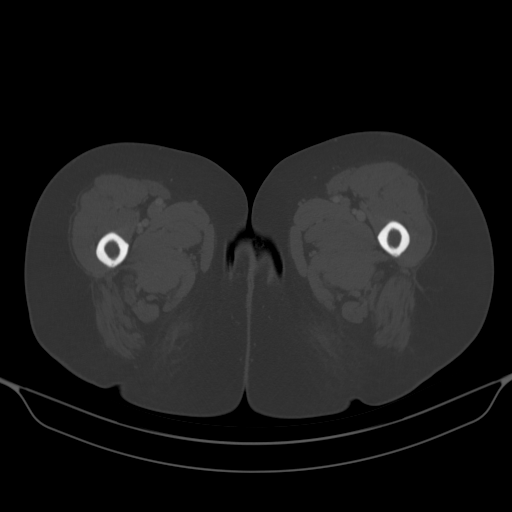
[im 21/155  soft-tissue]
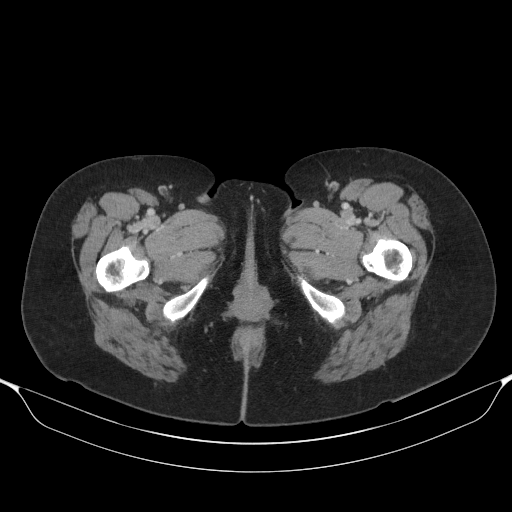
[im 31/155  soft-tissue]
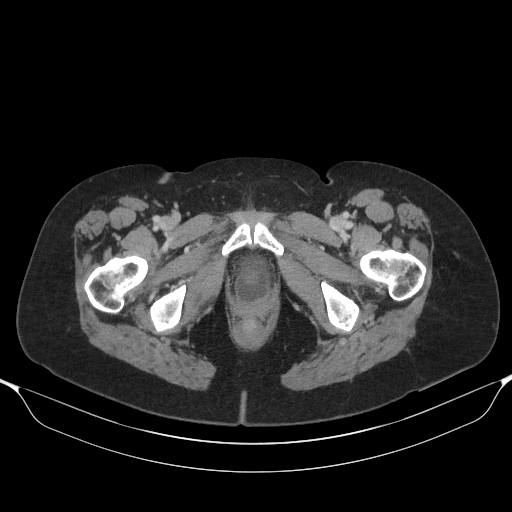
[im 52/155  soft-tissue]
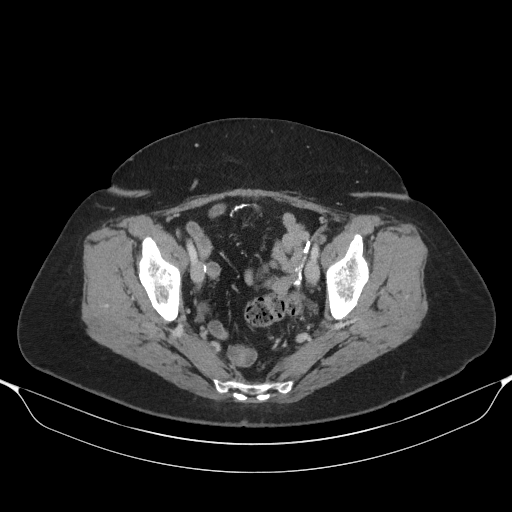
[im 62/155  soft-tissue]
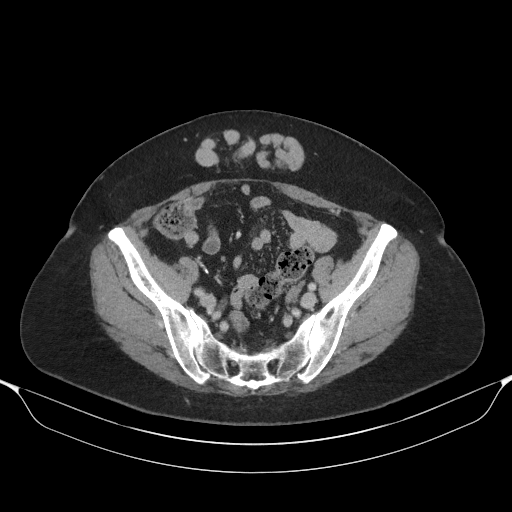
[im 72/155  soft-tissue]
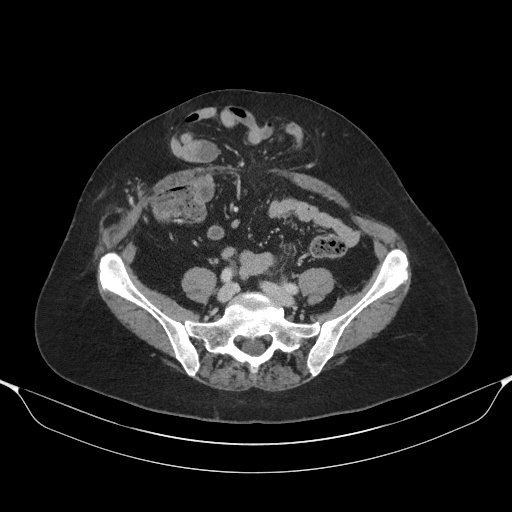
[im 83/155  soft-tissue]
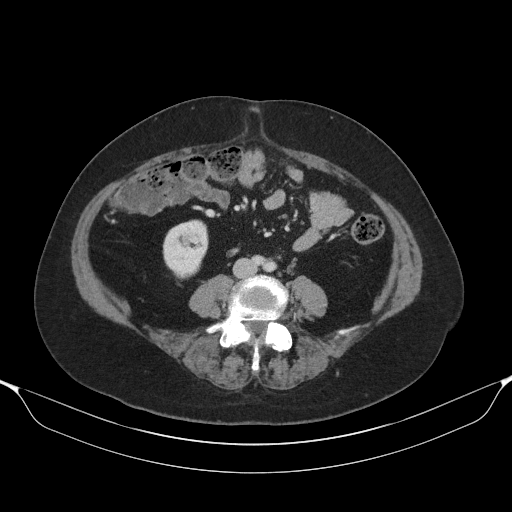
[im 93/155  soft-tissue]
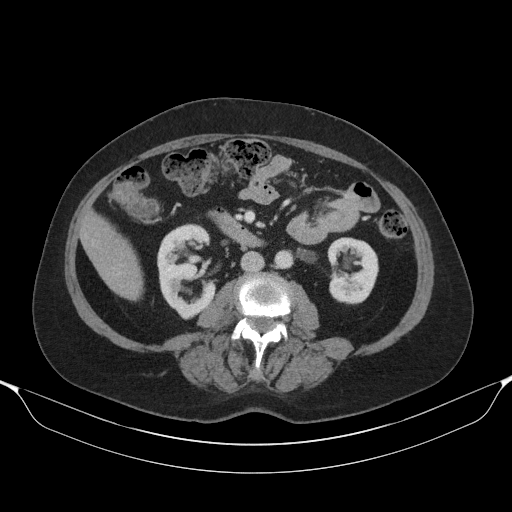
[im 103/155  soft-tissue]
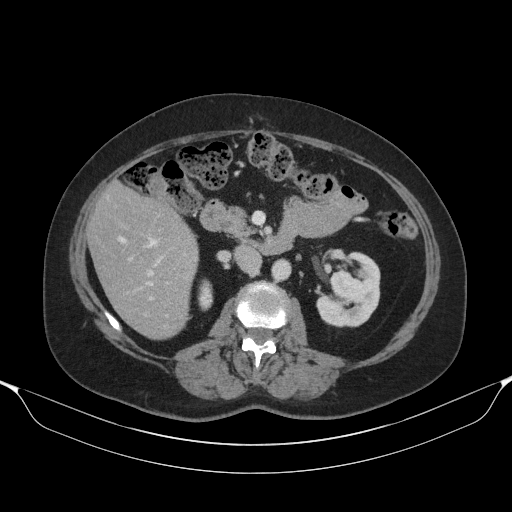
[im 103/155  bone]
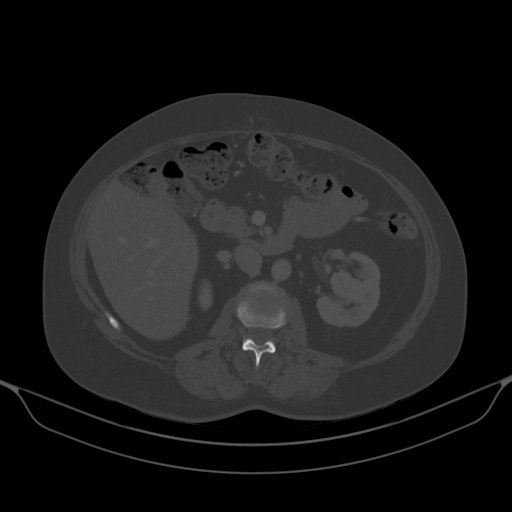
[im 124/155  soft-tissue]
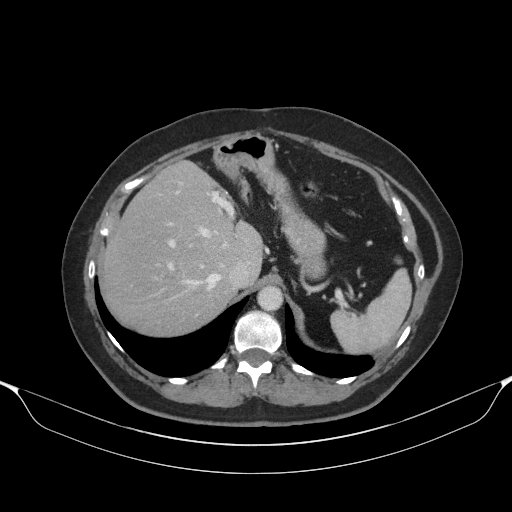
[im 134/155  soft-tissue]
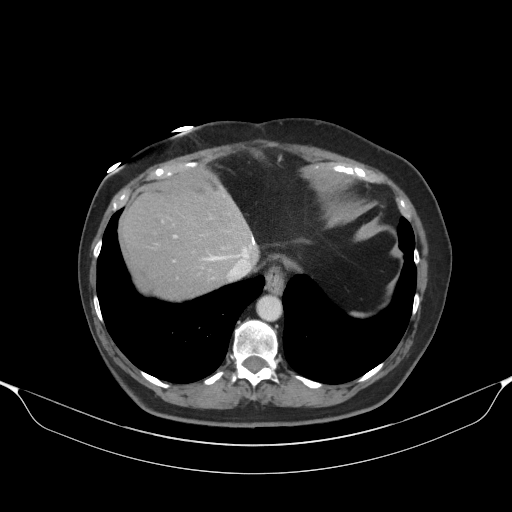
[im 144/155  soft-tissue]
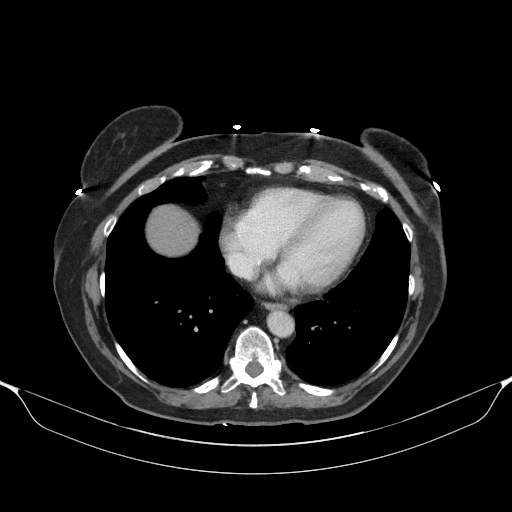

[15 of 46 positions shown; findings below may reference images not displayed]

FINDINGS: LUNGS/PLEURA: Stable 3 mm noncalcified RUL nodule (sequence 4 image 41).  Medial 
RUL atelectasis. Mild centrilobular emphysema. Mild cylindrical bronchiectasis. 
No pleural effusions. No pneumothorax. 
HEART: Heart is normal in size and configuration. 
MEDIASTINUM: No mass. 
AORTA/VESSELS: Atherosclerosis. No aneurysm. 
PULMONARY ARTERIES: No filling defects. Normal caliber. 
LYMPH NODES: No lymphadenopathy. 
LIVER/BILE DUCTS: Left hepatectomy. No mass or biliary dilatation. 
GALLBLADDER: Cholecystectomy. 
SPLEEN: Normal. 
PANCREAS: Normal. 
ADRENAL GLANDS: Normal. 
KIDNEYS: No mass, hydronephrosis or nephrolithiasis. 
STOMACH/BOWEL: Postsurgical change of the large and small bowel, without 
recurrent mass. No bowel wall thickening or obstruction. Appendectomy. 
MESENTERY/PERITONEAL SPACE: No mass, free fluid or pneumoperitoneum. 
PELVIC ORGANS: Hysterectomy. 
MUSCULOSKELETAL: Stable 8.1 x 1.6 x 8.9 cm left chest wall fatty mass without 
thick septa or nodular nonadipose components, consistent with lipoma. Right 
spigelian hernia contains mesenteric fat. Ventral pelvic hernia contains 
nonobstructed bowel and mesenteric fat. Degenerative change. No lytic or blastic 
lesions. Osteopenia.
IMPRESSION: 1.  Postsurgical change of the large/small bowel and left hepatectomy, without 
recurrent mass. 
2.  Stable 3 mm noncalcified RUL nodule. 
3.  No adenopathy. 
4.  Otherwise, no change. 
Recommendations for pulmonary nodule follow-up according to [HOSPITAL] 
Guidelines. 
Solitary nodule size: < 6 mm 
*Low-risk patients: No followup needed. 
*High-risk patients: Optional CT at 12 months. 
NOTE: 
Low risk patients: minimal or absent history of smoking and or other known risk 
factors. 
High risk patients: history of smoking or of other known risk factors (e.g. 
first degree relative with lung cancer, or exposure to asbestos, radon uranium) 
If a nodule up to 8mm is partly solid or is ground glass further follow up is 
required after 24 months to exclude possible slow growing adenocarcinoma (WOYOME) 
RADIATION DOSE REDUCTION: All CT scans are performed using radiation dose 
reduction techniques, when applicable.  Technical factors are evaluated and 
adjusted to ensure appropriate moderation of exposure.  Automated dose 
management technology is applied to adjust the radiation doses to minimize 
exposure while achieving diagnostic quality images.

## 2019-08-23 IMAGING — MR MRI ABDOMEN W/WO CONTRAST
17 of 20 series · 36 of 48 positions shown · non-contrast
Comparison: CT abdomen 08/10/2019 within the last 12 months. MR abdomen 
01/29/2019 within the last 12 months.

MRI ABDOMEN W/WO CONTRAST, 08/23/2019 [DATE]: 
CLINICAL INDICATION:  History of colorectal malignancy metastatic to the liver. 
Patient had partial hepatectomy for treatment as well as liver ablation and 
chemotherapy.
TECHNIQUE: Multiplanar, multiecho position MR images of the abdomen were 
performed with and without intravenous contrast enhancement.  7 cc Gadovist 
injected intravenously. The patient's eGFR was calculated to be 53.0 using the 
i-STAT device.

[Series 201: survey navi · axial · 15.0mm · 1.70mm/px · 1 of 11 slices shown]
[im 1/11]
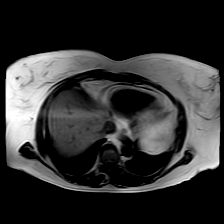

[Series 301: (id)* · coronal · 5.0mm · 0.68mm/px · 1 of 34 slices shown]
[im 1/34]
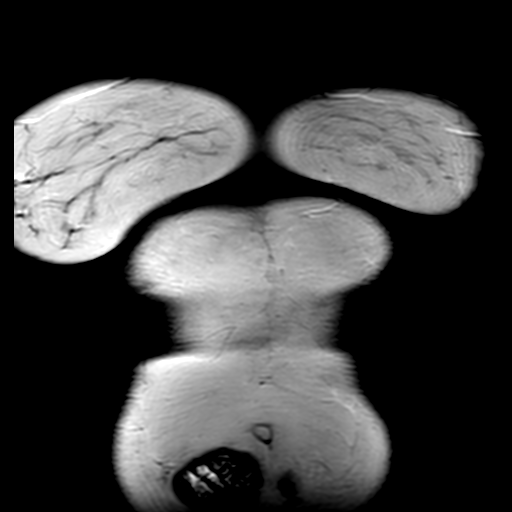

[Series 402: sout of phase · axial · 6.0mm · 1.15mm/px · 1 of 34 slices shown]
[im 1/34]
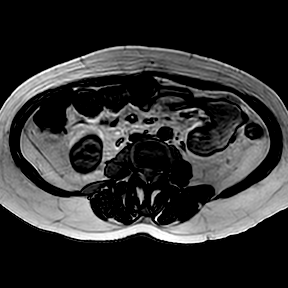

[Series 403: sin phase · axial · 6.0mm · 1.15mm/px · 1 of 34 slices shown]
[im 1/34]
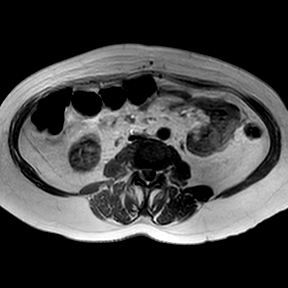

[Series 501: t2w_tse-navi · axial · 5.5mm · 0.74mm/px · 1 of 36 slices shown]
[im 1/36]
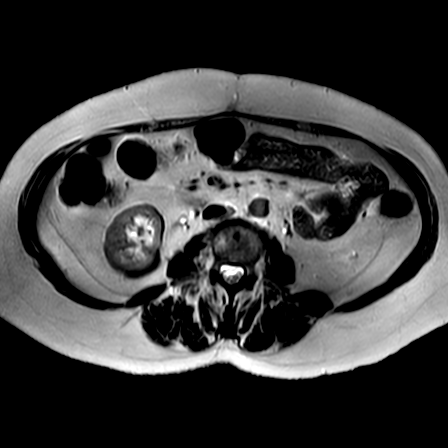

[Series 601: t2w_spair-navi · axial · 5.5mm · 0.86mm/px · 1 of 36 slices shown]
[im 1/36]
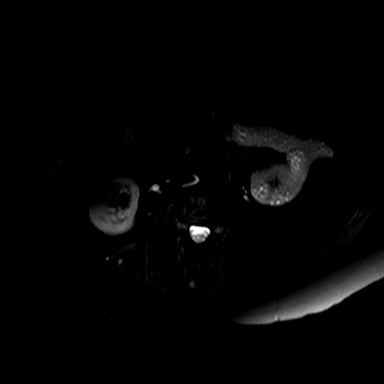

[Series 701: dwi_3b_nav · axial · 5.0mm · 1.56mm/px · z∈[-69,+173]mm · 2 of 90 slices shown]
[im 1/90]
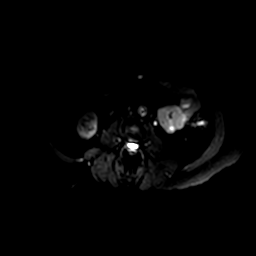
[im 90/90]
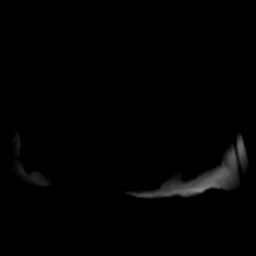

[Series 702: dadc 600 · axial · 5.0mm · 1.56mm/px · 1 of 45 slices shown]
[im 1/45]
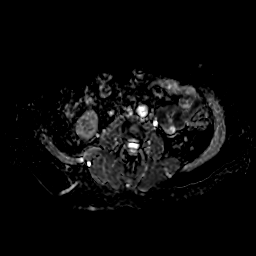

[Series 703: sb0 · axial · 5.0mm · 1.56mm/px · 1 of 45 slices shown]
[im 1/45]
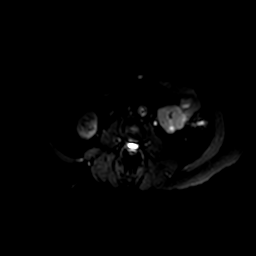

[Series 704: (id) · axial · 5.0mm · 1.56mm/px · 1 of 45 slices shown]
[im 1/45]
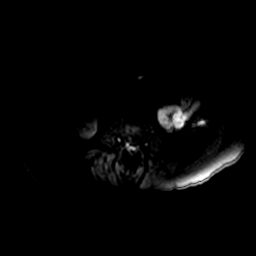

[Series 802: DIXON · axial · 4.0mm · 0.86mm/px · z∈[-72,+176]mm · 3 of 125 slices shown (1 of 5)]
[im 1/125]
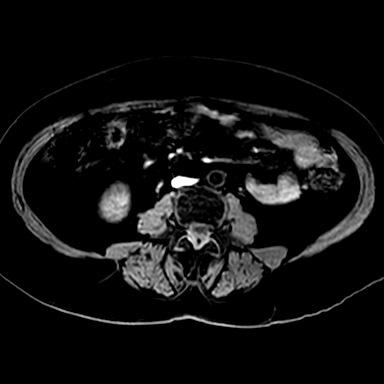
[im 63/125]
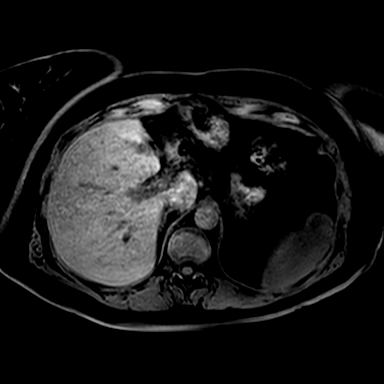
[im 125/125]
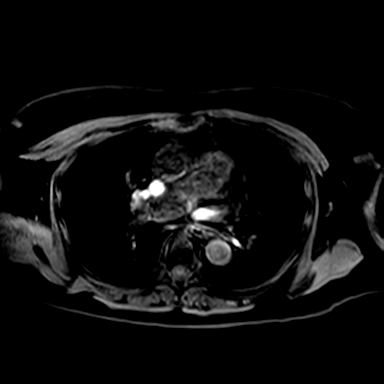

[Series 803: DIXON · axial · 4.0mm · 0.86mm/px · z∈[-72,+176]mm · 3 of 125 slices shown (2 of 5)]
[im 1/125]
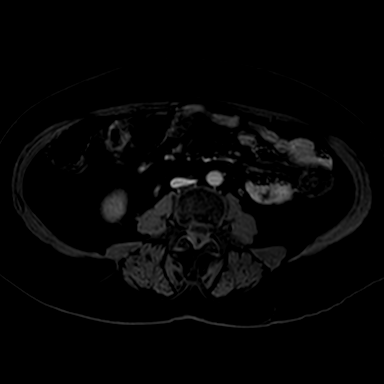
[im 63/125]
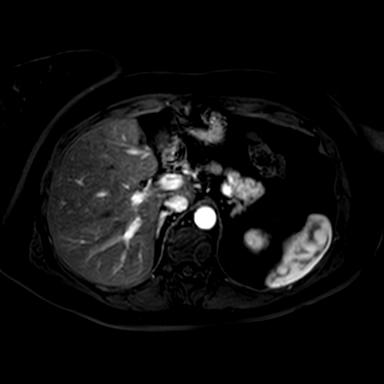
[im 125/125]
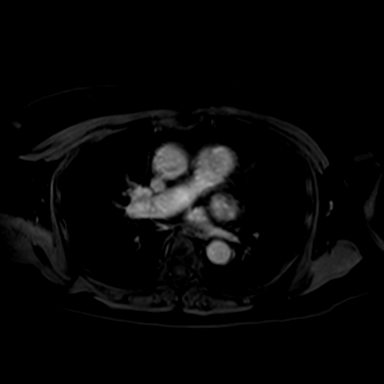

[Series 804: DIXON · axial · 4.0mm · 0.86mm/px · z∈[-72,+176]mm · 3 of 125 slices shown (3 of 5)]
[im 1/125]
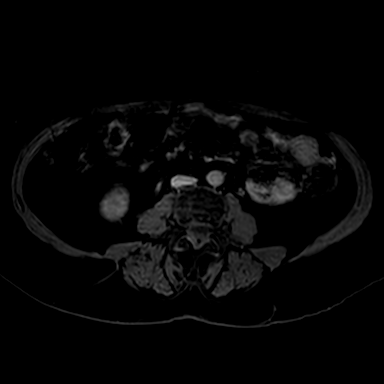
[im 63/125]
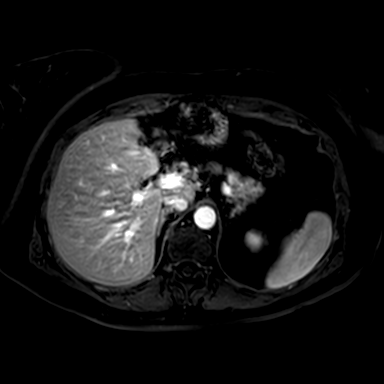
[im 125/125]
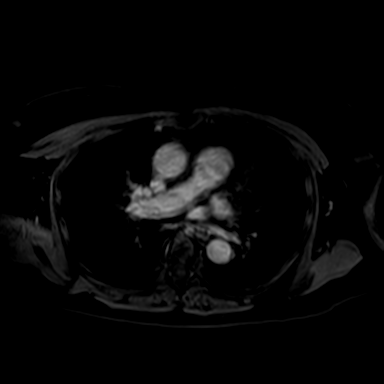

[Series 805: DIXON · axial · 4.0mm · 0.86mm/px · z∈[-72,+176]mm · 4 of 125 slices shown (4 of 5)]
[im 1/125]
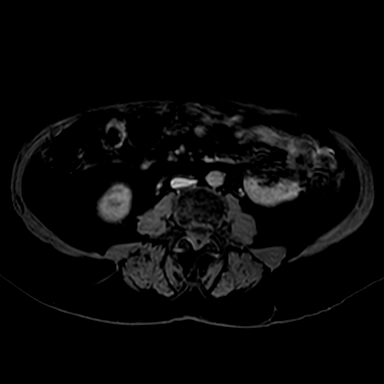
[im 42/125]
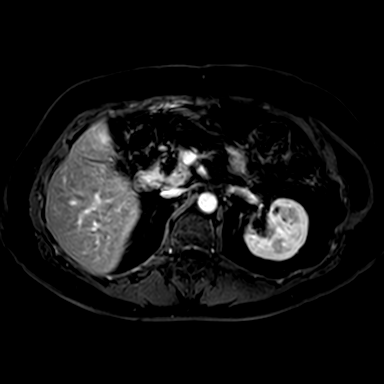
[im 83/125]
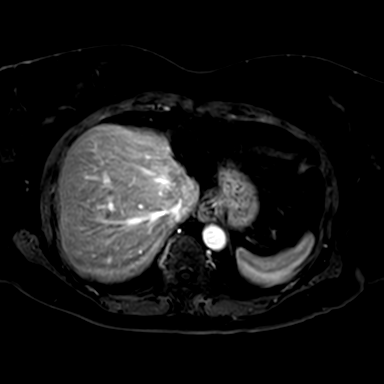
[im 125/125]
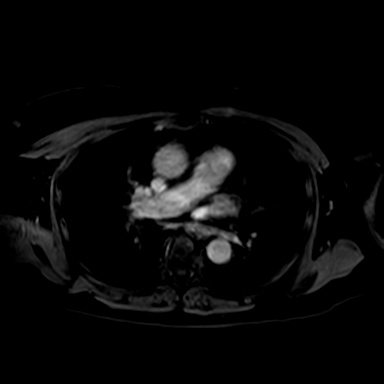

[Series 806: DIXON · axial · 4.0mm · 0.86mm/px · z∈[-72,+176]mm · 4 of 125 slices shown (5 of 5)]
[im 1/125]
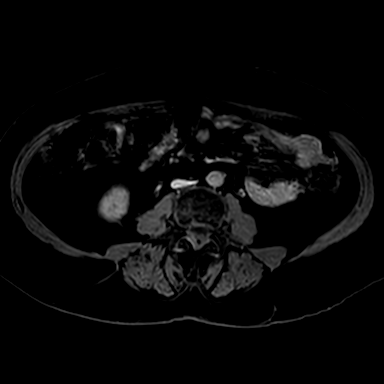
[im 42/125]
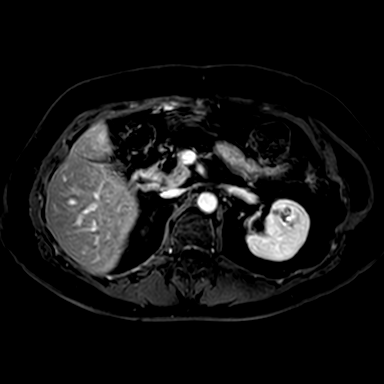
[im 83/125]
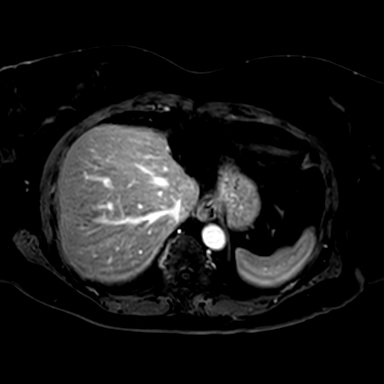
[im 125/125]
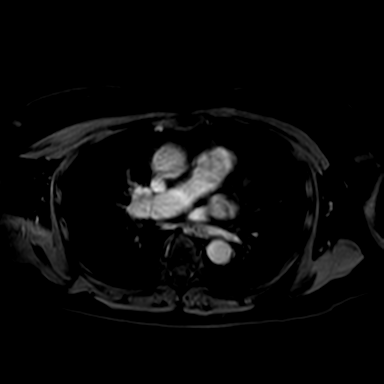

[Series 807: DIXON post-contrast · axial · 4.0mm · 0.86mm/px · z∈[-72,+176]mm · 4 of 125 slices shown (1 of 2)]
[im 1/125]
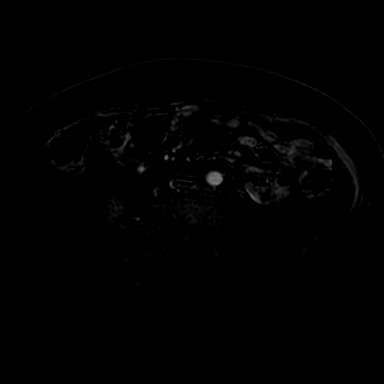
[im 42/125]
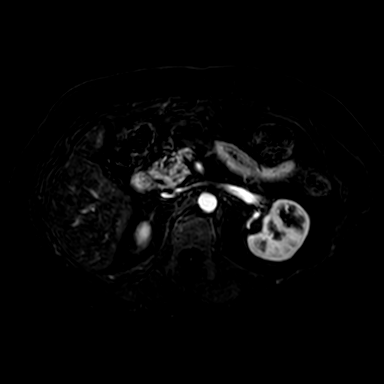
[im 83/125]
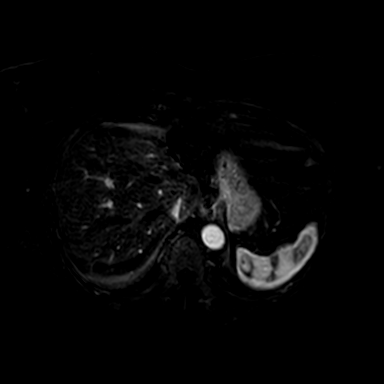
[im 125/125]
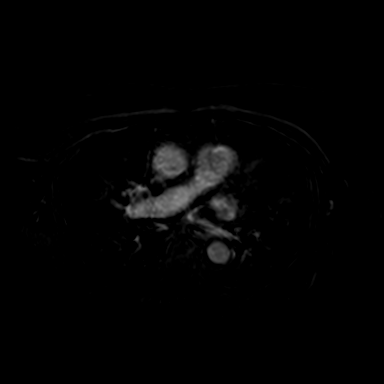

[Series 808: DIXON post-contrast · axial · 4.0mm · 0.86mm/px · z∈[-72,+176]mm · 4 of 125 slices shown (2 of 2)]
[im 1/125]
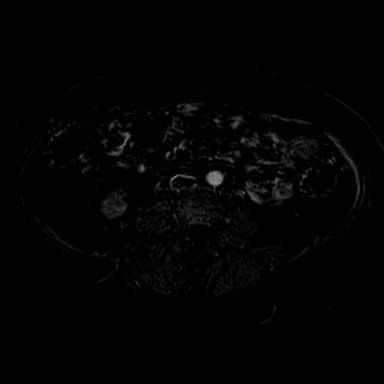
[im 42/125]
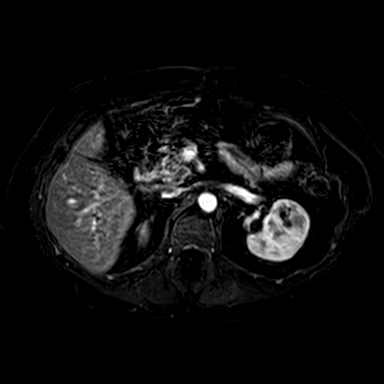
[im 83/125]
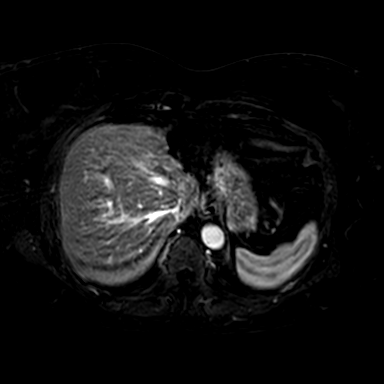
[im 125/125]
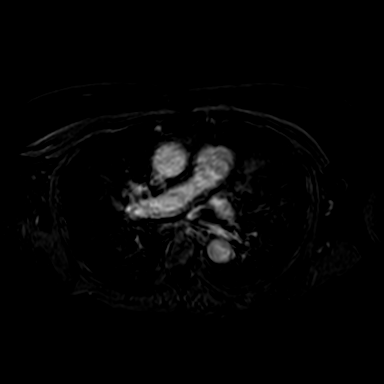

[36 of 48 positions shown; findings below may reference images not displayed]

FINDINGS: There has been resection of the LEFT lobe of the liver. There is diffuse MRI 
signal decrease on out of phase T1 images indicating hepatic steatosis. No focal 
hepatic lesions are detected. There is no evidence for restricted diffusion 
within the liver. The portal vein is patent. There is a fatty lesion in the head 
of the pancreas, stable since recent exams. This is minimally larger than on an 
old exam from 2262 suggesting incidental benign pathology. There is also an 
incidental lipoma between the chest wall musculature along the inferior lateral 
aspect of the thorax. The gallbladder is surgically absent. The biliary tract is 
decompressed. No splenic pathology is found. Both of the adrenal glands are 
normal. No significant renal pathology found. Abdominal aorta is normal in 
caliber. No free intraperitoneal fluid or abnormal lymph nodes are detected in 
the abdomen. Minimal hiatal hernia is incidentally noted.
IMPRESSION: No evidence for developing hepatic metastasis or other evidence of recurrent 
malignancy in the abdomen.

## 2019-11-30 IMAGING — CT CT CHEST WITH CONTRAST
1 of 3 series · 14 of 46 positions shown, 18 images · IV contrast (ISOVUE 300)
Comparison: Comparison was made to the prior exam(s) within the last 12 months 
dated  08/10/2019

CT CHEST WITH CONTRAST, 11/30/2019 [DATE]: 
CLINICAL INDICATION: History of colon metastases with a right upper lobe 
pulmonary nodule 
A search for DICOM formatted images was conducted for prior CT imaging studies 
completed at a non-affiliated media free facility.
TECHNIQUE: The chest was scanned from base of neck through the lung bases with 
577cc of Isovue 300 injected intravenously on a high resolution low dose CT 
scanner.  Routine MPR and MIP 3D renderings were reconstructed on an independent 
workstation with concurrent physician supervision.

[Series 4: chest pe 2.0 i31s 3 · axial · 0.76mm/px · z∈[+1010,+1268]mm · 14 of 149 slices shown, 18 images]
[im 10/149  soft-tissue]
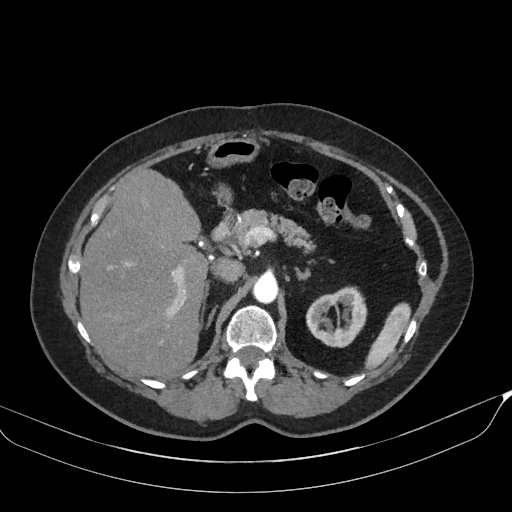
[im 10/149  lung]
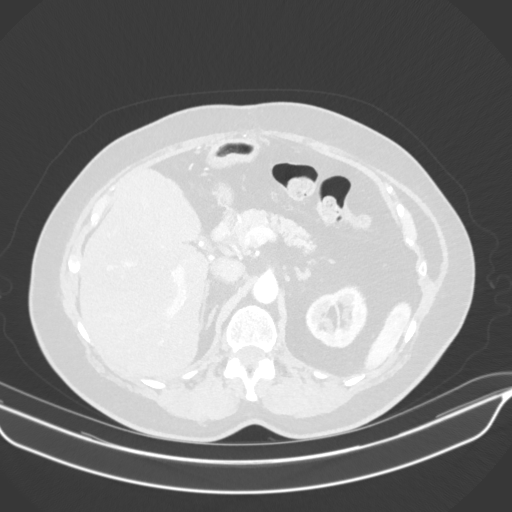
[im 20/149  lung]
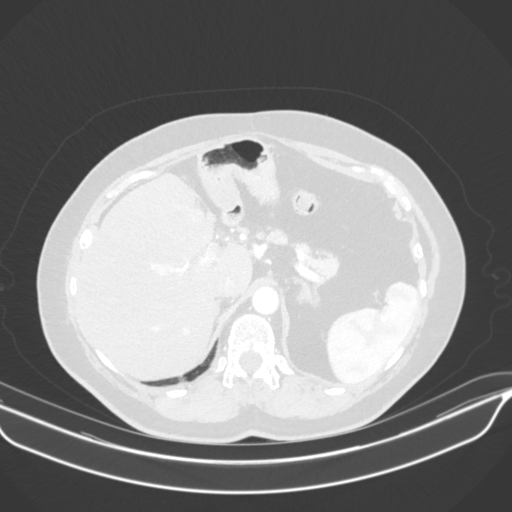
[im 29/149  lung]
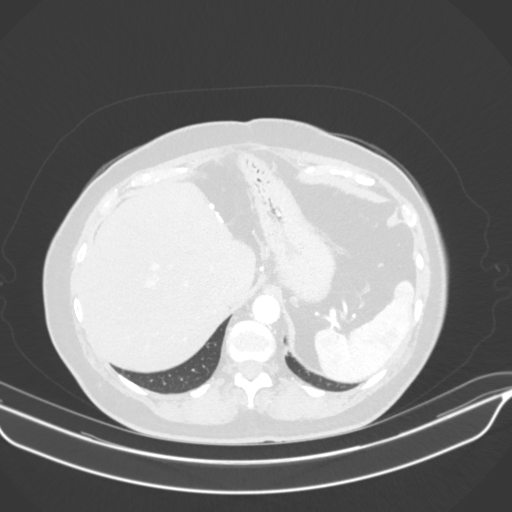
[im 39/149  lung]
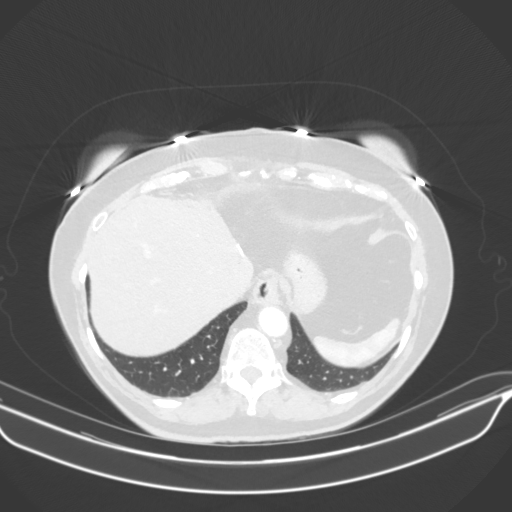
[im 48/149  soft-tissue]
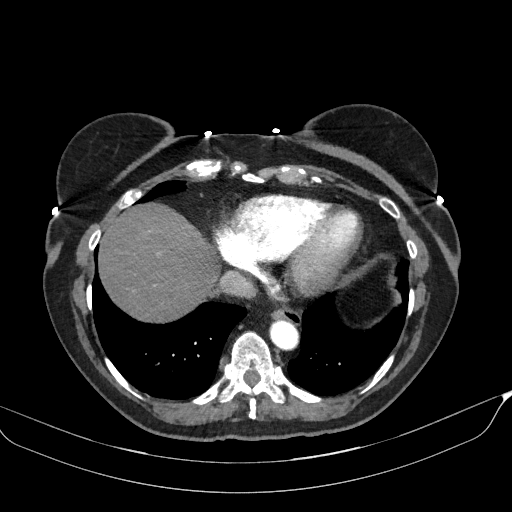
[im 48/149  lung]
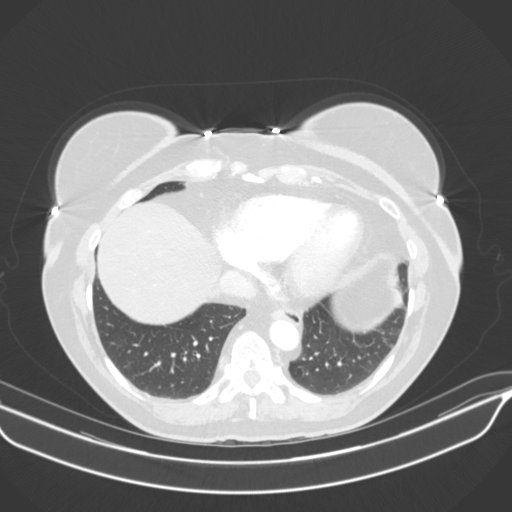
[im 58/149  lung]
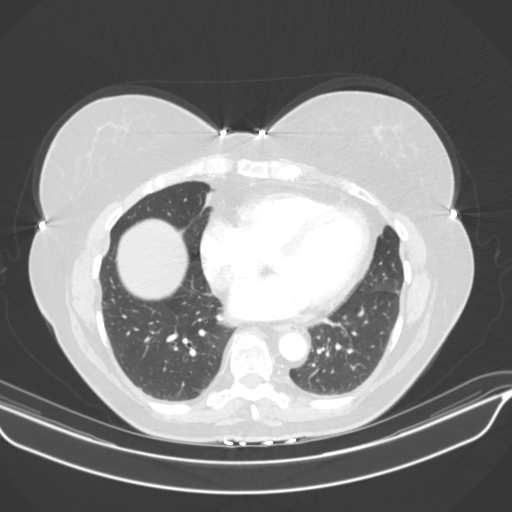
[im 67/149  lung]
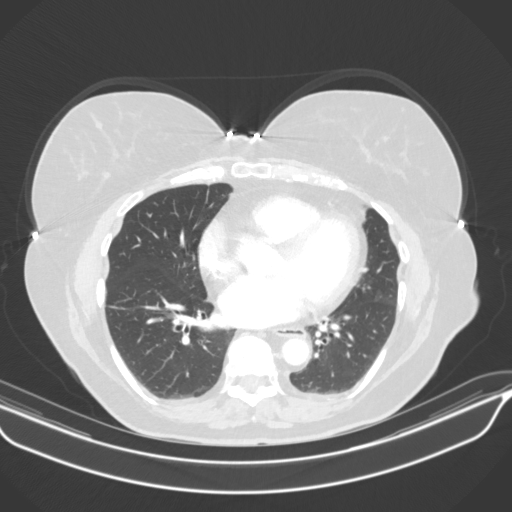
[im 82/149  lung]
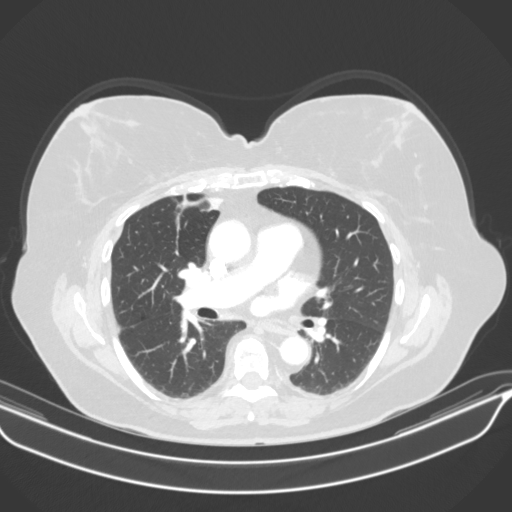
[im 91/149  soft-tissue]
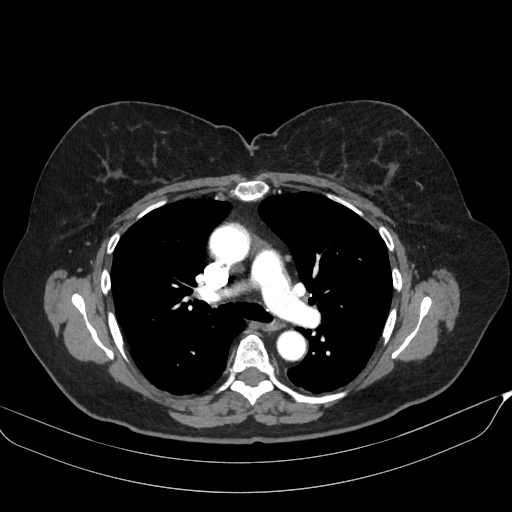
[im 91/149  lung]
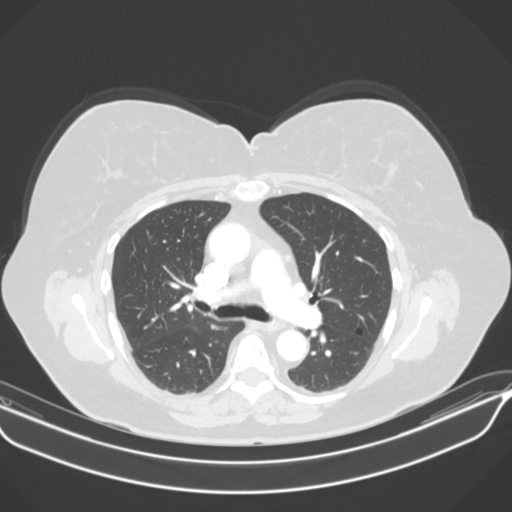
[im 101/149  lung]
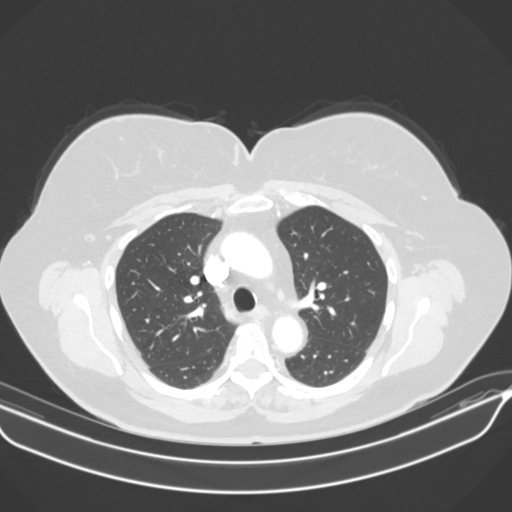
[im 110/149  lung]
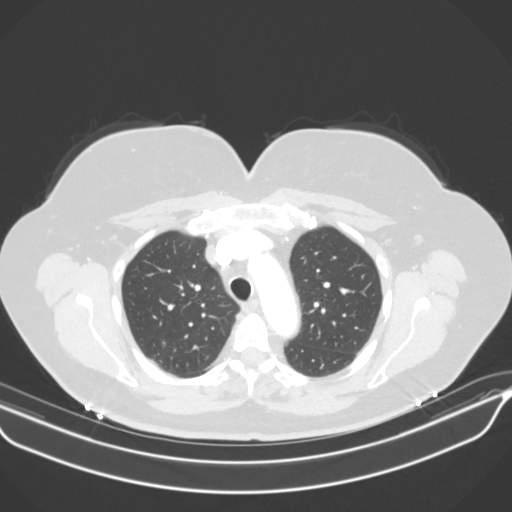
[im 120/149  lung]
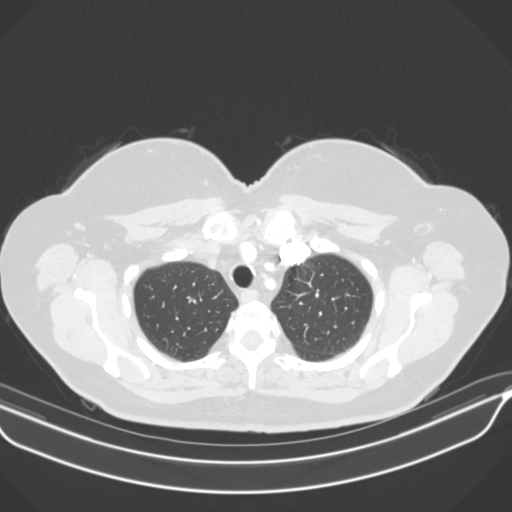
[im 129/149  soft-tissue]
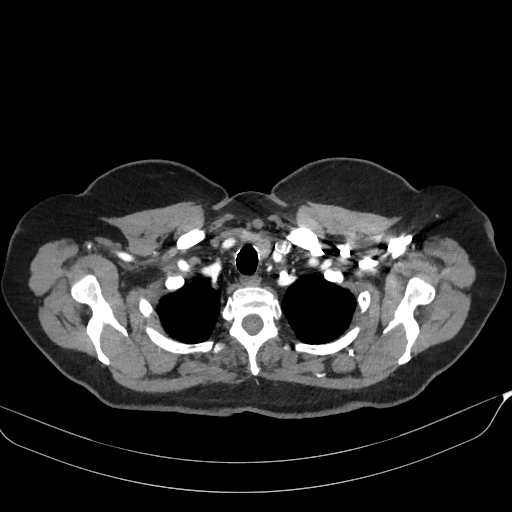
[im 129/149  lung]
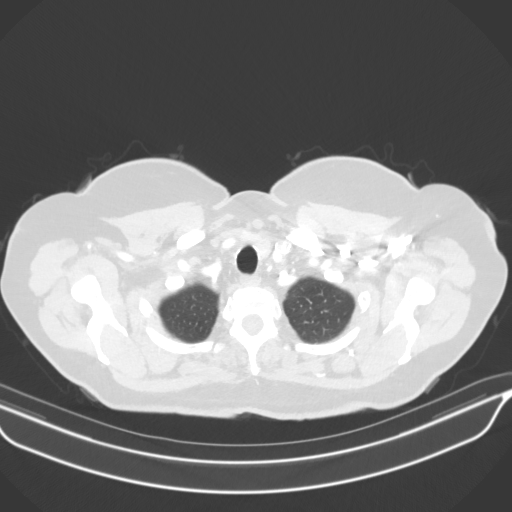
[im 139/149  lung]
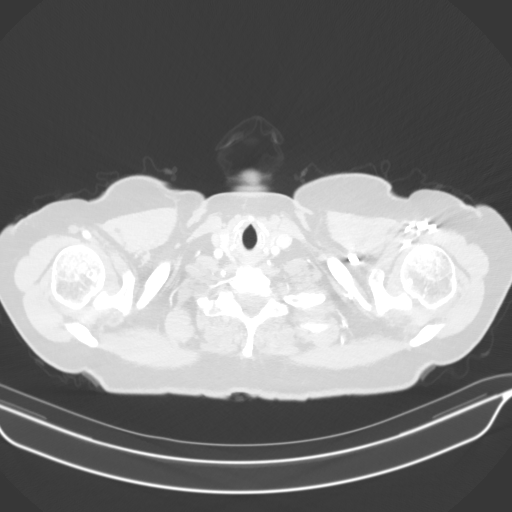

[14 of 46 positions shown; findings below may reference images not displayed]

FINDINGS: There is a tiny focus of localized increased density in the right 
upper lobe that is also apparent on the comparison examination and does not 
appear to have changed significantly in size measuring approximately 2 mm. The 
density is currently most evident on image 40 of series 5. A band of atelectasis 
or scarring is exhibited in the medial aspect of the right middle lobe, more 
apparent on the current examination than on the earlier study. There are no new 
abnormalities evident in the lung. The appearance of the mediastinal structures 
is within normal limits. No pleural effusions or other signs of active pleural 
disease are apparent. No acute abnormalities the upper abdomen are observed. 
There is evidence of a previous cholecystectomy. There is evidence of previous 
left lobe liver resection. No acute pathologic changes in the surrounding bone 
or soft tissues of the thorax are observed.
IMPRESSION: No interval changes the appearance of the thorax are exhibited to suggest 
recurrent malignancy. The tiny nodule in the right upper lobe is stable. There 
is some right middle lobe scarring or chronic atelectasis. 
Previous left lobe liver resection. 
Prior cholecystectomy 
RADIATION DOSE REDUCTION: All CT scans are performed using radiation dose 
reduction techniques, when applicable.  Technical factors are evaluated and 
adjusted to ensure appropriate moderation of exposure.  Automated dose 
management technology is applied to adjust the radiation doses to minimize 
exposure while achieving diagnostic quality images.

## 2019-12-16 IMAGING — CT CT BRAIN W/WO CONTRAST  ([facility])
1 of 2 series · 13 of 30 positions shown, 17 images · non-contrast
Comparison: There are no previous exams available for comparison.

CT BRAIN W/WO CONTRAST  ([facility]), 12/16/2019 [DATE]: 
(Films were taken at [facility] on 12/16/2019and interpreted at 
[facility] on 12/16/2019. 
CLINICAL INDICATION: Malignant neoplasm of rectum, colon 
A search for DICOM formatted images was conducted for prior CT imaging studies 
completed at a non-affiliated media free facility.

[Series 2: brain wo · axial · 0.45mm/px · z∈[+1051,+1191]mm · 13 of 34 slices shown, 17 images]
[im 3/34  brain]
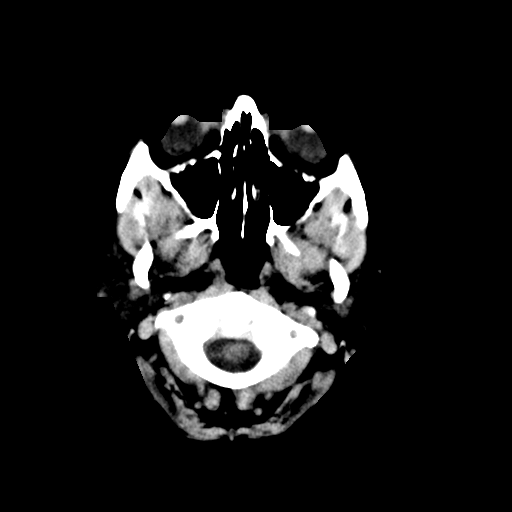
[im 3/34  bone]
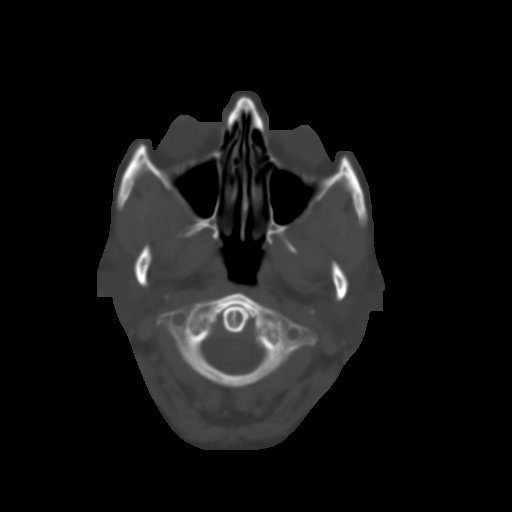
[im 5/34  brain]
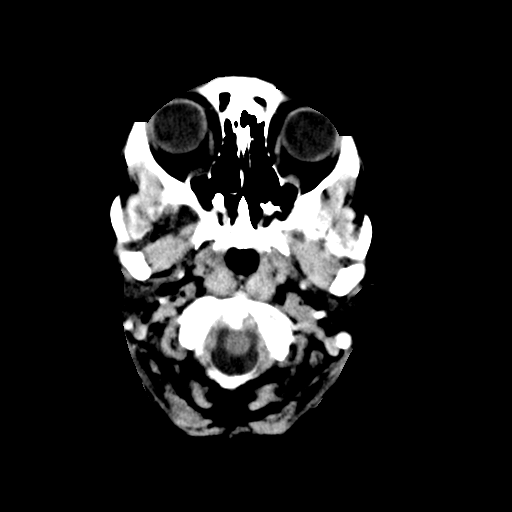
[im 8/34  brain]
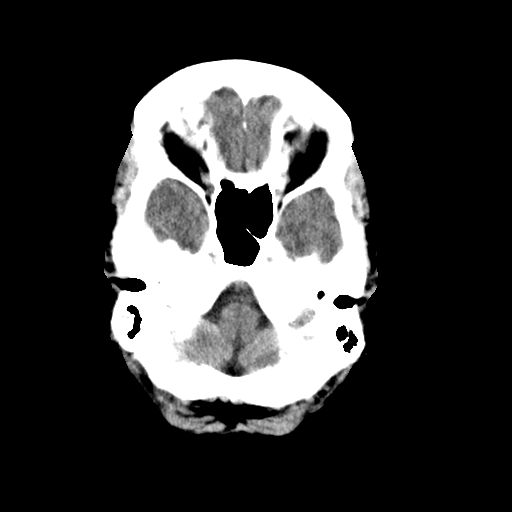
[im 10/34  brain]
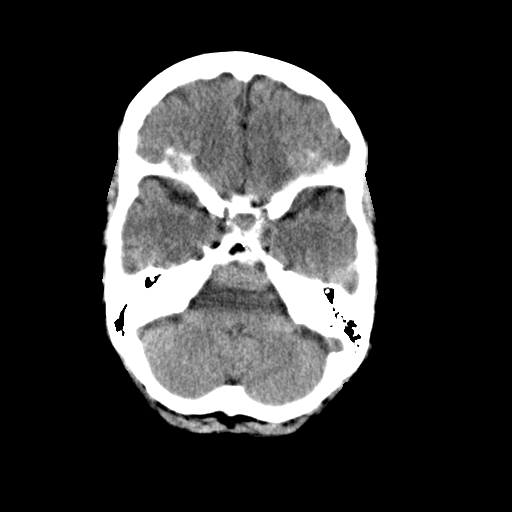
[im 12/34  brain]
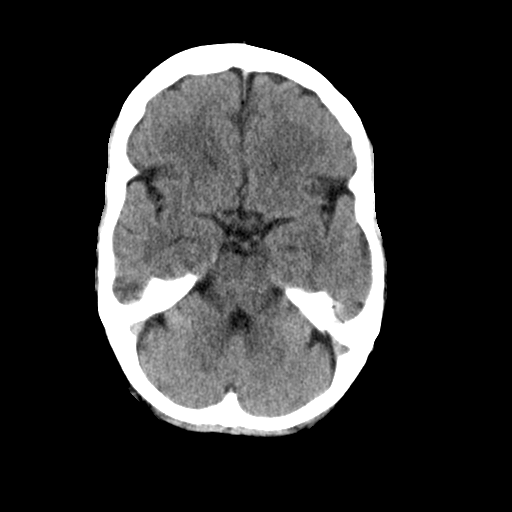
[im 12/34  bone]
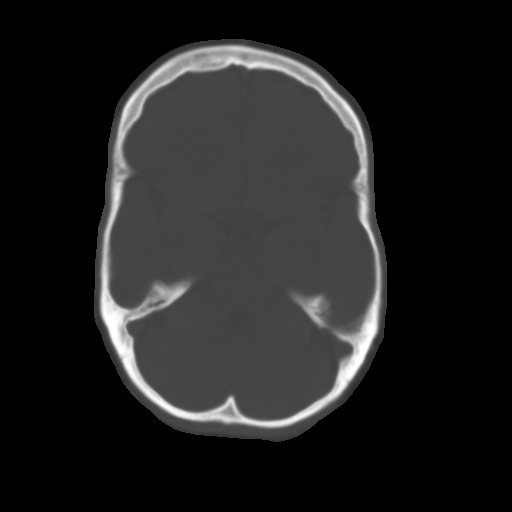
[im 15/34  brain]
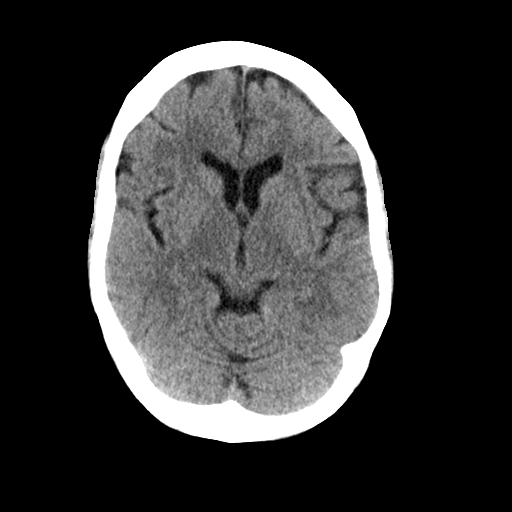
[im 17/34  brain]
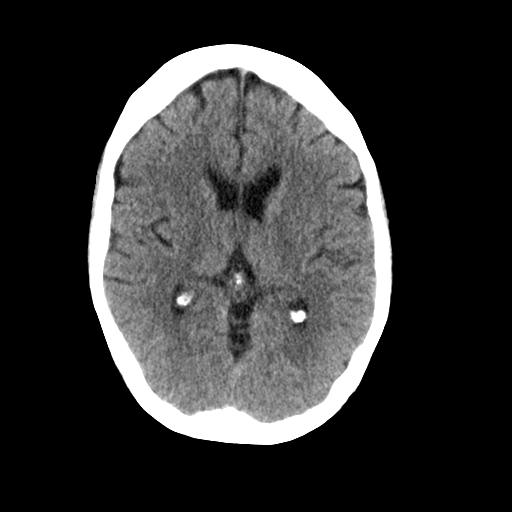
[im 19/34  brain]
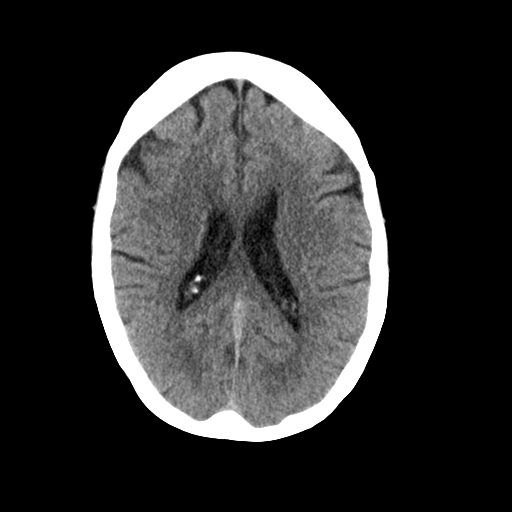
[im 22/34  brain]
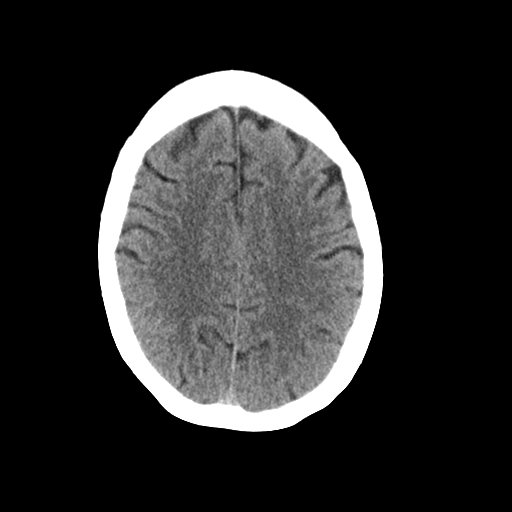
[im 22/34  bone]
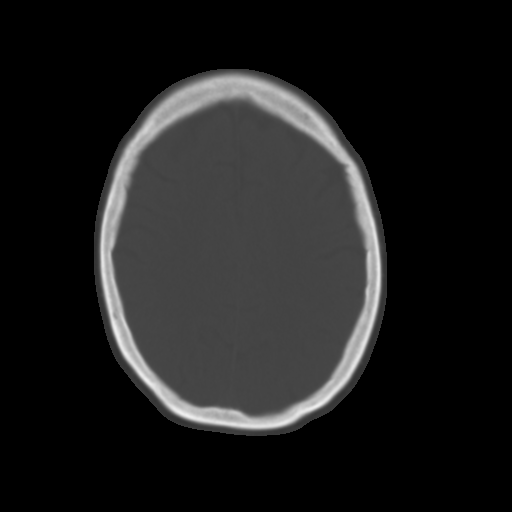
[im 24/34  brain]
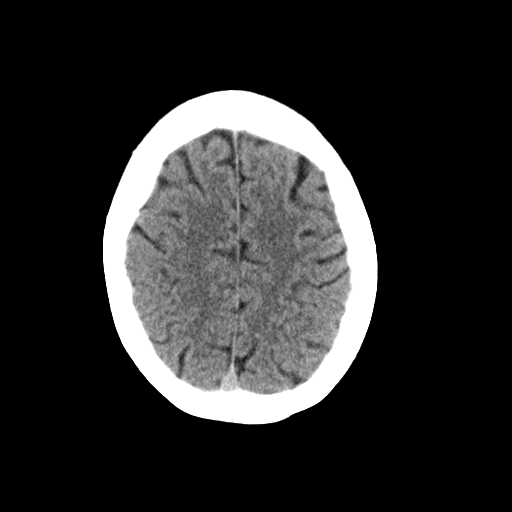
[im 26/34  brain]
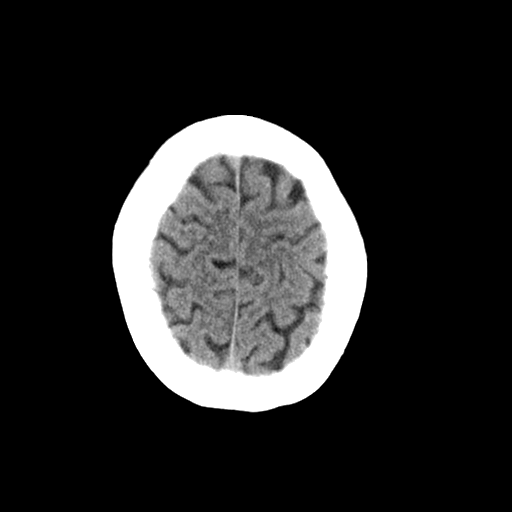
[im 29/34  brain]
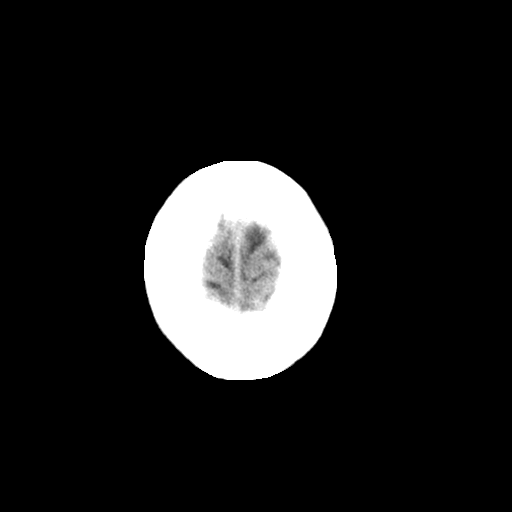
[im 31/34  brain]
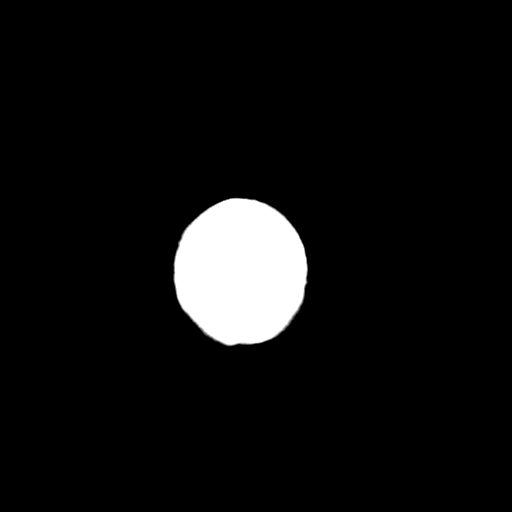
[im 31/34  bone]
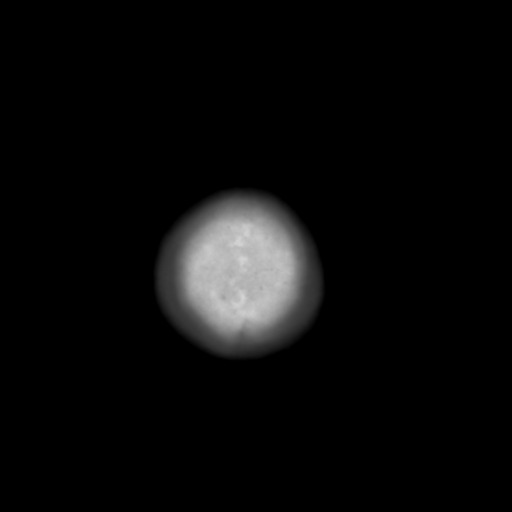

[13 of 30 positions shown; findings below may reference images not displayed]

FINDINGS: Today's study is limited by motion artifact. Today's nonenhanced 
images show no hemorrhage. Enhanced images show no pathologic enhancement or 
mass. No recent infarct. No hydrocephalus. No discrete brainstem or cerebellar 
findings. The calvarium is intact. Visualized paranasal sinuses and otomastoid 
spaces appear clear. There is no orbital mass.
IMPRESSION: No intracranial mass. No evidence for recent infarct, hemorrhage or 
hydrocephalus. 
RADIATION DOSE REDUCTION: All CT scans are performed using radiation dose 
reduction techniques, when applicable.  Technical factors are evaluated and 
adjusted to ensure appropriate moderation of exposure.  Automated dose 
management technology is applied to adjust the radiation doses to minimize 
exposure while achieving diagnostic quality images.

## 2020-03-03 IMAGING — CT CT CHEST/ABDOMEN AND PELVIS WITH CONTRAST  ([facility])
1 of 4 series · 13 of 32 positions shown, 18 images · IV contrast (isovue)
Comparison: Comparison was made to the prior exam(s) within the last 12 months 
dated  03/03/2020

CT CHEST/ABDOMEN AND PELVIS WITH CONTRAST  ([facility]), 03/03/2020 [DATE]: 
(Films were taken at [facility].) 
CLINICAL INDICATION:  Colon cancer with resection and ileostomy. Partial hepatic 
resection. Prior chemotherapy. 
A search for DICOM formatted images was conducted for prior CT imaging studies 
completed at a non-affiliated media free facility.
TECHNIQUE: The chest, abdomen and pelvis were scanned with 100 ml of Isovue 300 
injected intravenously on a high-resolution CT scanner using dose reduction 
techniques.  Routine MPR reconstructions were performed.

[Series 2: cap w · axial · 0.91mm/px · z∈[+549,+1110]mm · 13 of 217 slices shown, 18 images]
[im 15/217  soft-tissue]
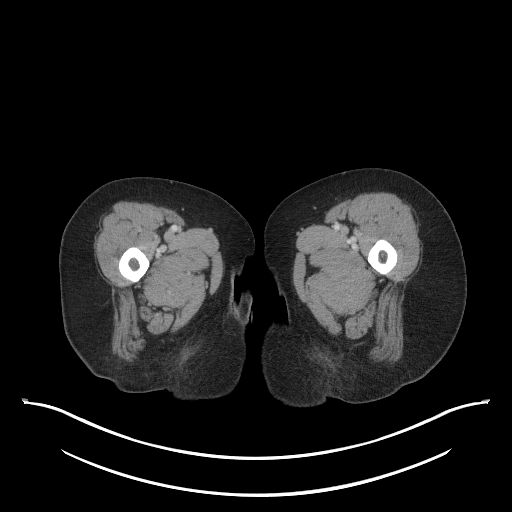
[im 15/217  bone]
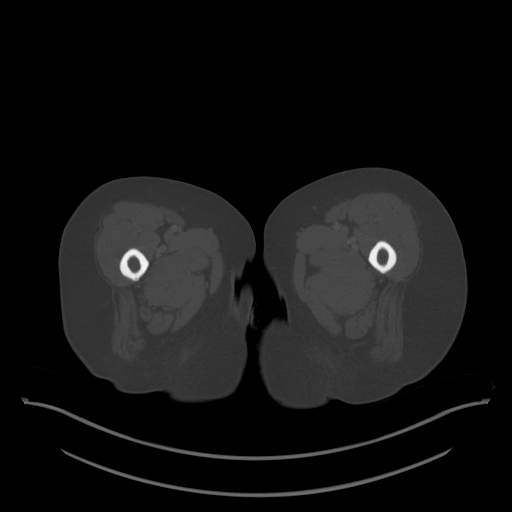
[im 29/217  soft-tissue]
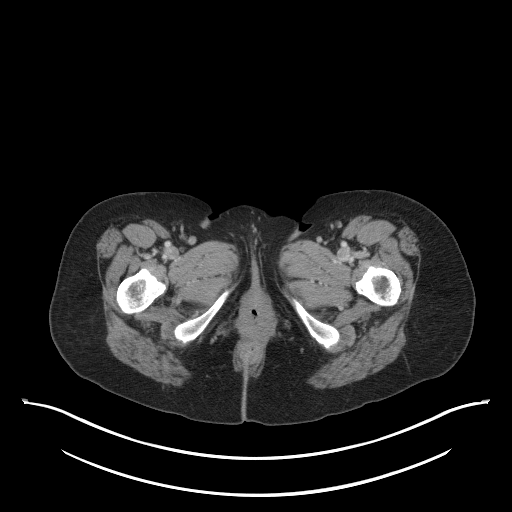
[im 44/217  soft-tissue]
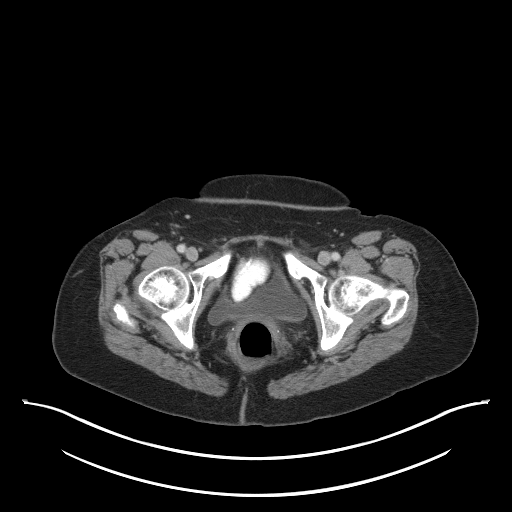
[im 73/217  soft-tissue]
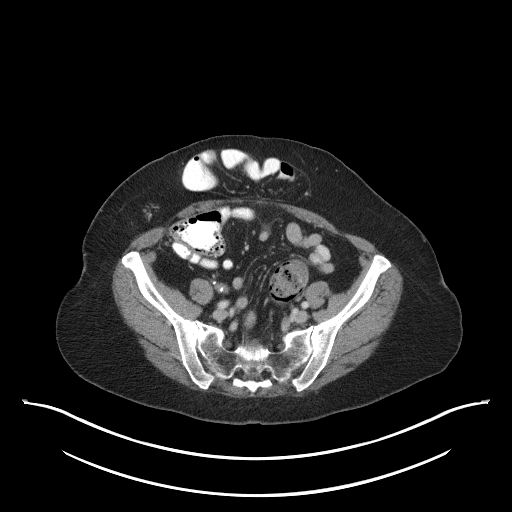
[im 87/217  soft-tissue]
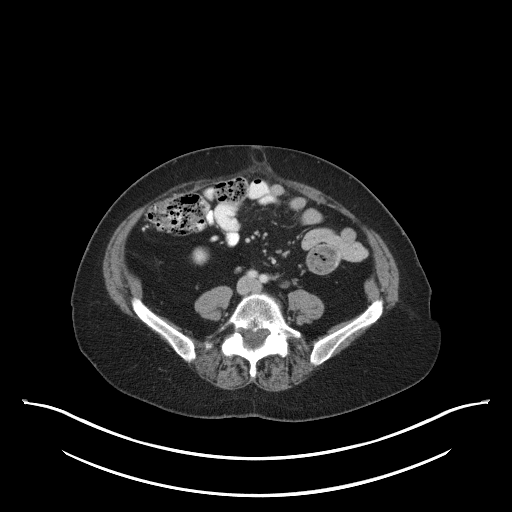
[im 101/217  soft-tissue]
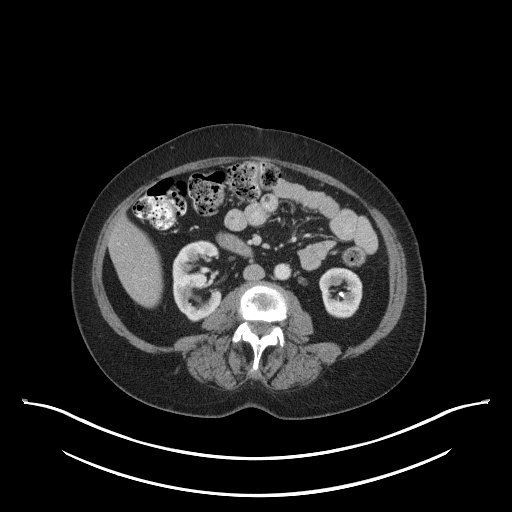
[im 116/217  soft-tissue]
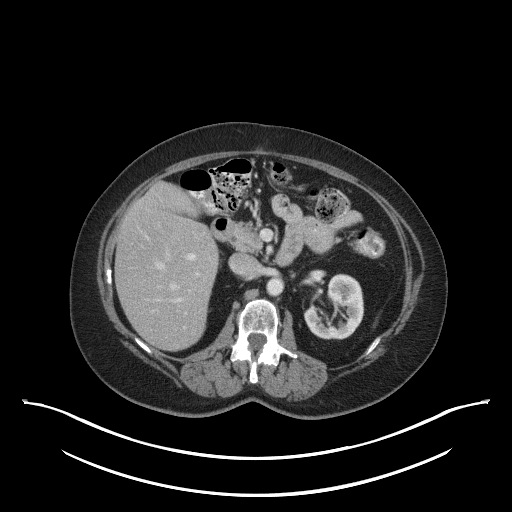
[im 130/217  soft-tissue]
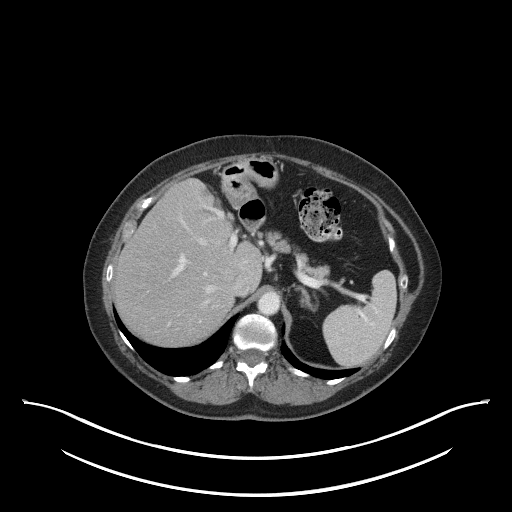
[im 145/217  soft-tissue]
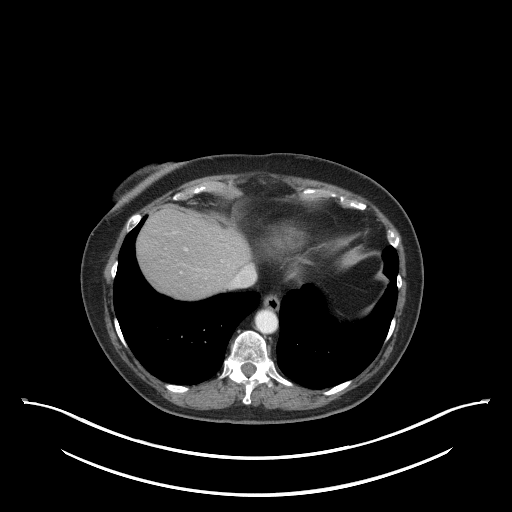
[im 145/217  bone]
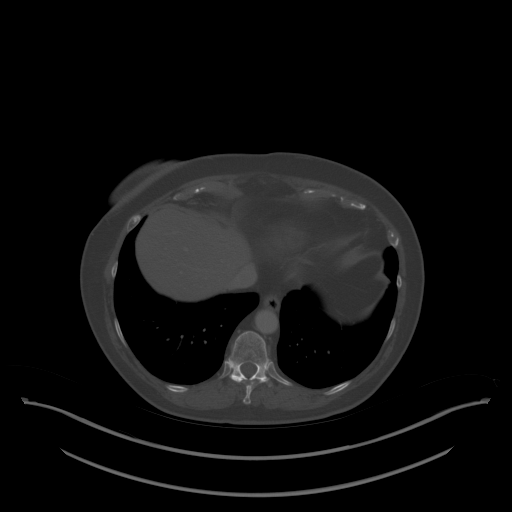
[im 159/217  lung]
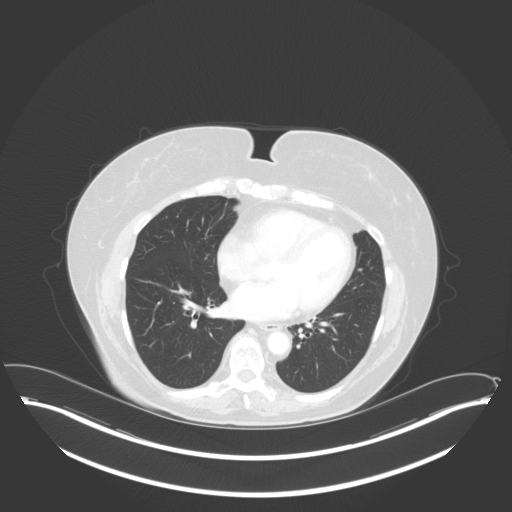
[im 173/217  soft-tissue]
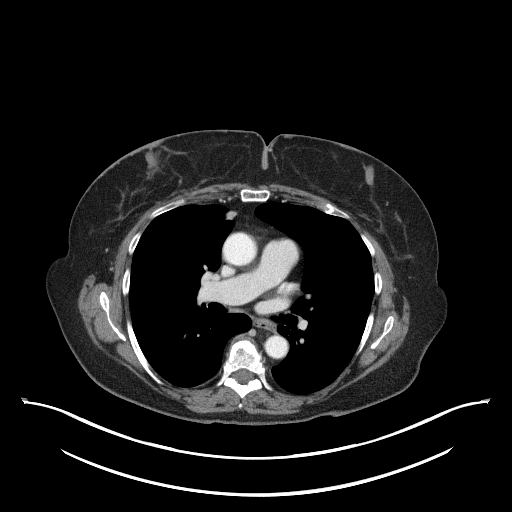
[im 173/217  lung]
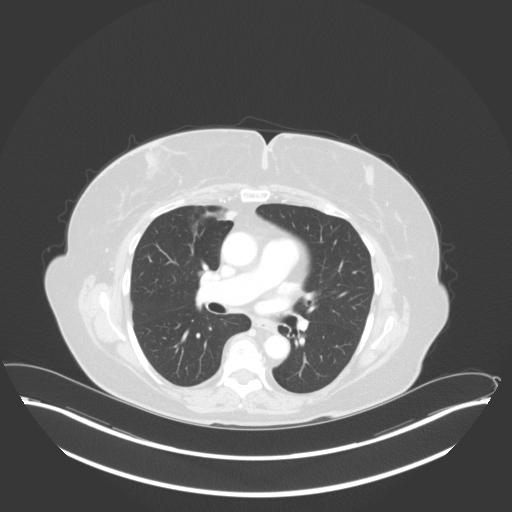
[im 188/217  soft-tissue]
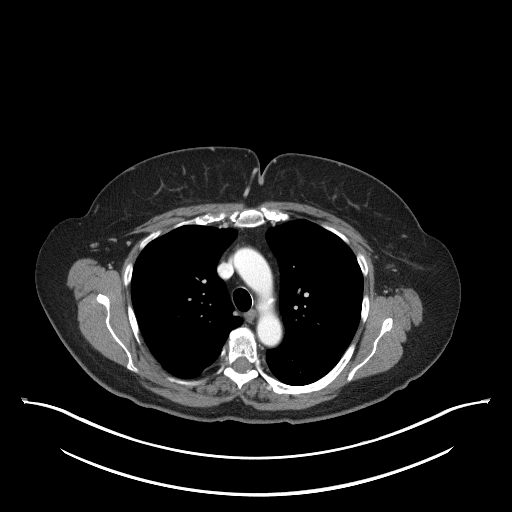
[im 188/217  lung]
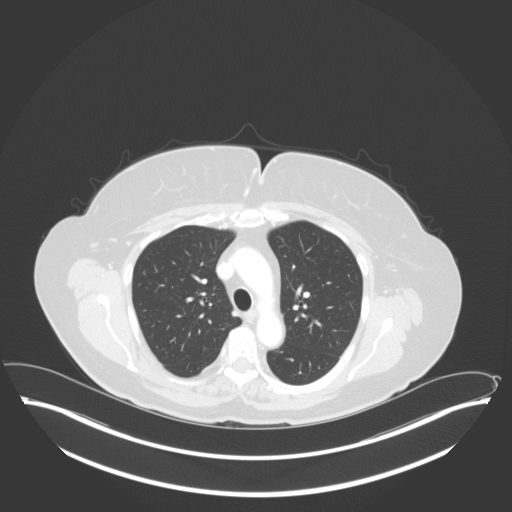
[im 202/217  soft-tissue]
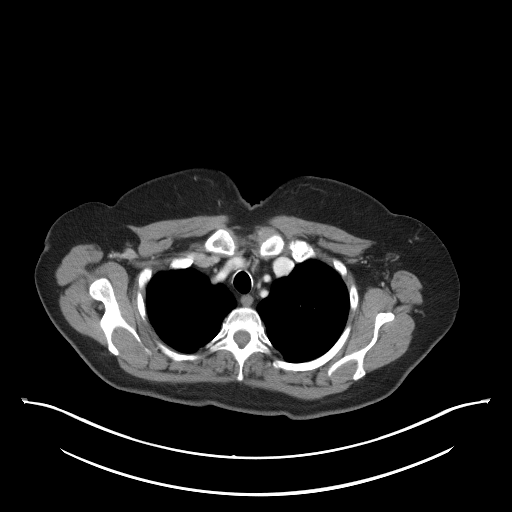
[im 202/217  lung]
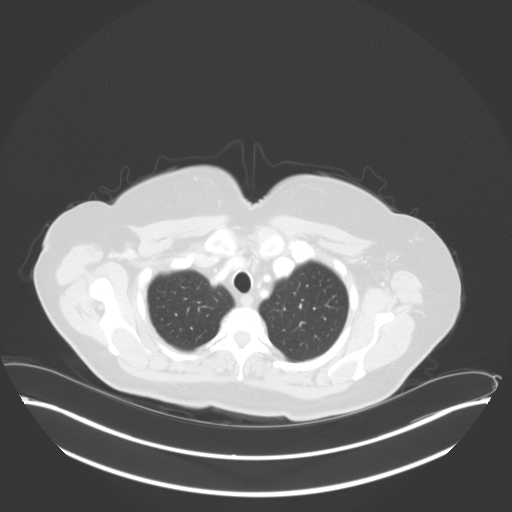

[13 of 32 positions shown; findings below may reference images not displayed]

FINDINGS: LUNGS/PLEURA: Medial RUL and lingular fibrosis. Peripheral RLL atelectasis. Mild 
centrilobular emphysema. Mild cylindrical bronchiectasis with 3 mm curvilinear 
RUL density corresponding to wall thickening. No pleural effusions. No 
pneumothorax. 
HEART: Heart is normal in size and configuration. 
MEDIASTINUM: No mass. 
AORTA/VESSELS: Atherosclerosis. No aneurysm. 
PULMONARY ARTERIES: No filling defects. Normal caliber. 
LYMPH NODES: No lymphadenopathy. 
LIVER/BILE DUCTS: Left hepatectomy and stable 0.9 cm marginal hypodensity in 
segment VIII, consistent with postsurgical change. No mass or biliary 
dilatation. 
GALLBLADDER: Cholecystectomy. 
SPLEEN: Normal. 
PANCREAS: Normal. 
ADRENAL GLANDS: Normal. 
KIDNEYS: Nonobstructing 7 mm left lower pole renal calculus (638 HU). No mass or 
hydronephrosis. 
STOMACH/BOWEL: Postsurgical change of the large and small bowel, without 
recurrent mass. No bowel wall thickening or obstruction. Appendectomy. 
MESENTERY/PERITONEAL SPACE: No mass, free fluid or pneumoperitoneum. 
PELVIC ORGANS: Hysterectomy. Pelvic clips. 
MUSCULOSKELETAL: Stable 7.9 x 1.6 cm left inferior chest wall fatty mass without 
thick septa or nodular nonadipose components, consistent with lipoma. Right 
spigelian hernia contains mesenteric fat. Ventral pelvic hernia contains 
nonobstructed bowel and mesenteric fat. Small fat-containing umbilical hernia. 
Degenerative change. Grade 1 anterolisthesis of L4 upon L5. No lytic or blastic 
lesions. Osteopenia.
IMPRESSION: 1.  Postsurgical change of the large/small bowel and left hepatectomy, without 
recurrent mass. 
2.  No adenopathy. 
3.  Otherwise, no change. 
RADIATION DOSE REDUCTION: All CT scans are performed using radiation dose 
reduction techniques, when applicable.  Technical factors are evaluated and 
adjusted to ensure appropriate moderation of exposure.  Automated dose 
management technology is applied to adjust the radiation doses to minimize 
exposure while achieving diagnostic quality images.

## 2020-05-19 IMAGING — MG MAMMOGRAPHY SCREENING BILATERAL 3D TOMOSYNTHESIS WITH CAD
8 series · 8 of 24 positions shown · non-contrast
Comparison: Comparison was made to prior exams. 
BREAST DENSITY: (Level B) There are scattered areas of fibroglandular density.

MAMMOGRAPHY SCREENING BILATERAL 3D TOMOSYNTHESIS WITH CAD, 05/19/2020 [DATE]: 
CLINICAL INDICATION: Screening exam.
TECHNIQUE: Digital bilateral mammograms and 3-D Tomosynthesis were obtained. 
These were interpreted both primarily and with the aid of computer-aided 
detection system.

[L CC]
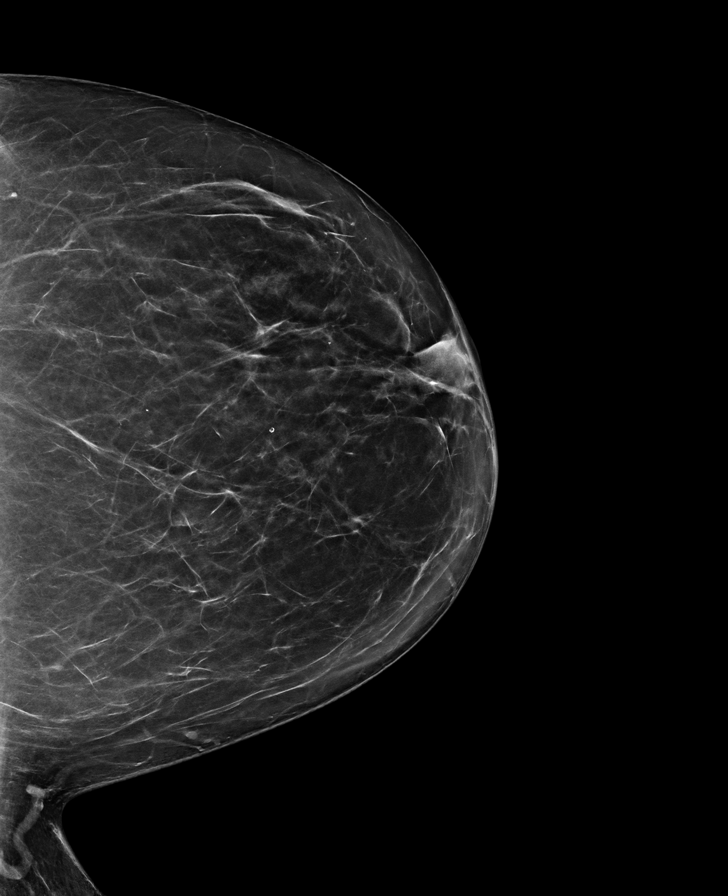

[R CC]
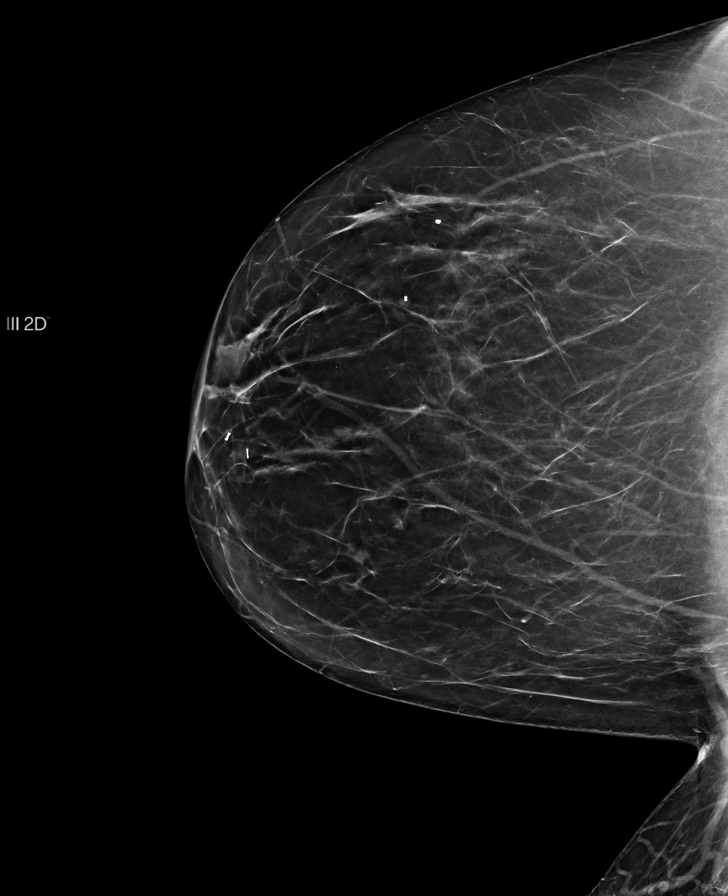

[R MLO]
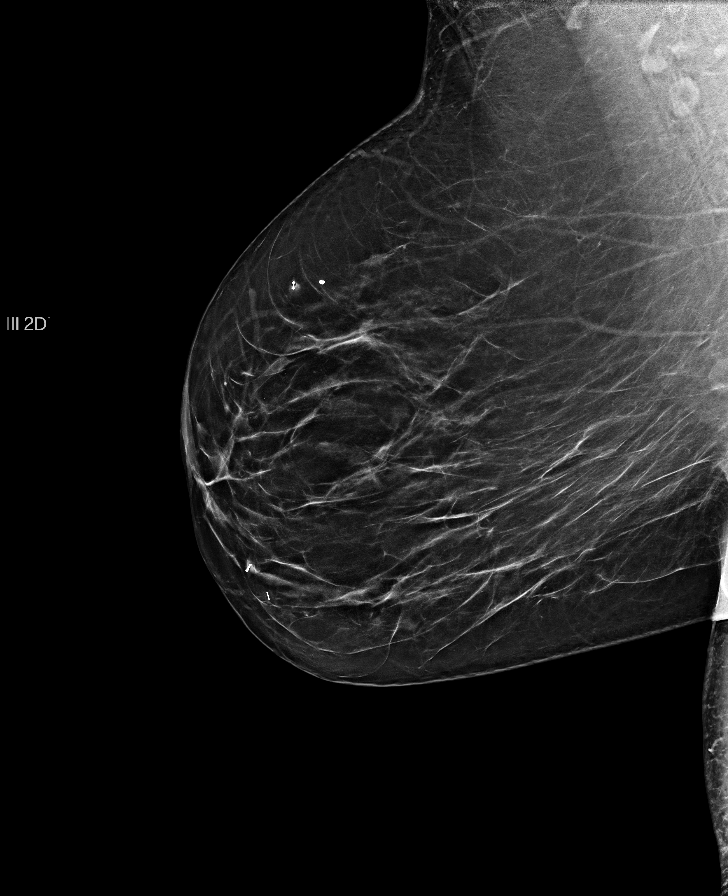

[L MLO]
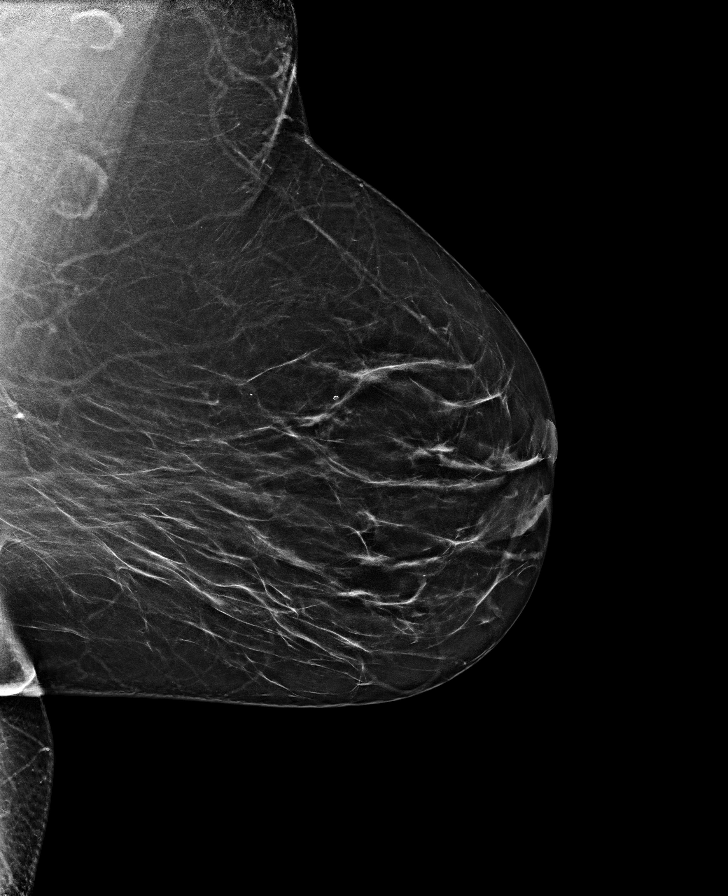

[R MLO tomo · tomo slice 38/75.0]
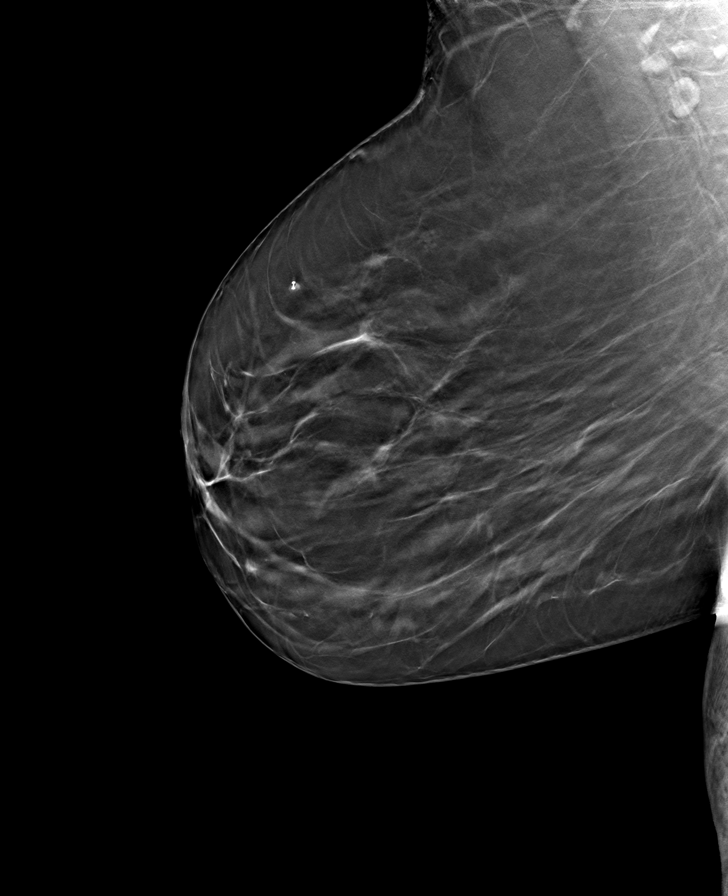

[L CC tomo · tomo slice 32/63.0]
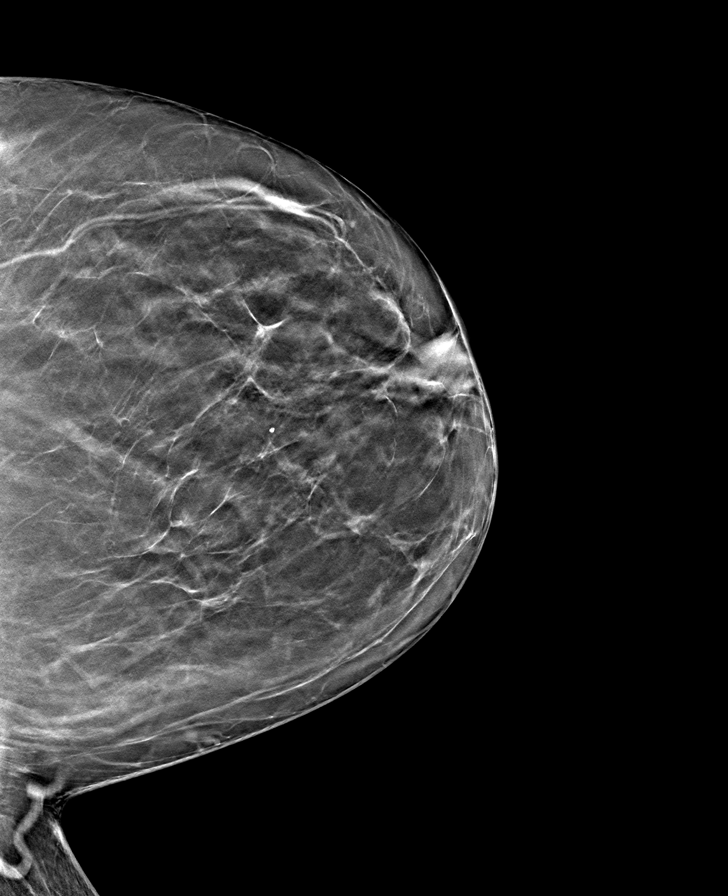

[R CC tomo · tomo slice 32/63.0]
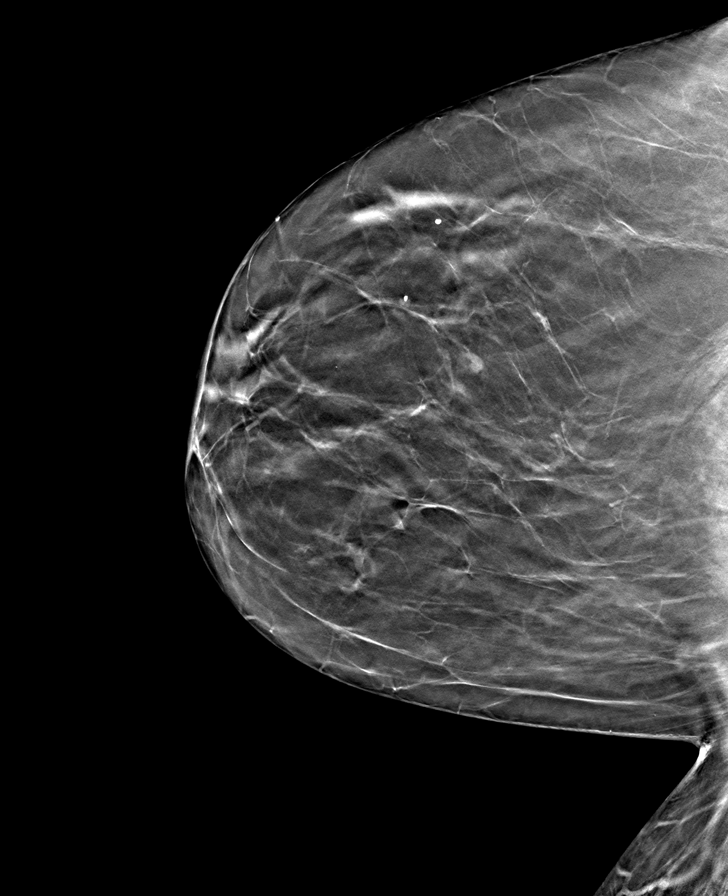

[L MLO tomo · tomo slice 41/80.0]
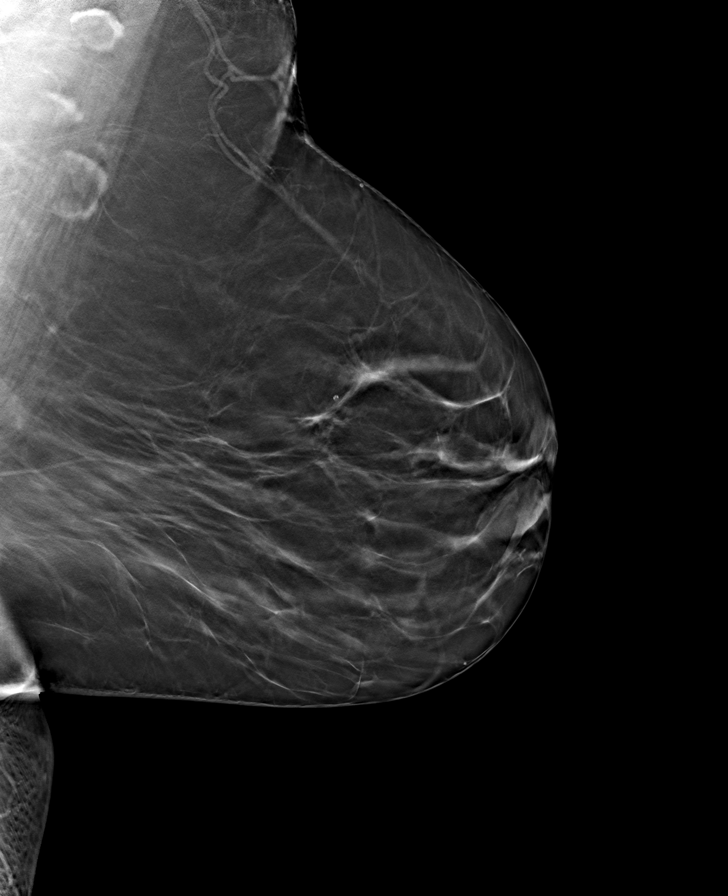

[8 of 24 positions shown; findings below may reference images not displayed]

FINDINGS: There are postprocedure clips involving the right breast.No suspicious 
mass, calcifications, or area of architectural distortion in either breast.
IMPRESSION: No mammographic evidence of malignancy in either breast. 
( BI-RADS 2) Benign findings. Routine mammographic follow-up is recommended.

## 2020-06-22 IMAGING — MR MRI LUMBAR SPINE WITHOUT CONTRAST
4 of 6 series · 14 of 48 positions shown · IV contrast (gadolinium)
Comparison: None

MRI LUMBAR SPINE WITHOUT CONTRAST, 06/22/2020 [DATE]: 
CLINICAL INDICATION: Low back pain with radiation down the right leg
TECHNIQUE: Multiplanar, multiecho position MR images of the lumbar spine were 
performed without intravenous gadolinium enhancement.

[Series 101: survey · axial · 10.0mm · 1.39mm/px · z∈[-15,+199]mm · 4 of 9 slices shown]
[im 1/9]
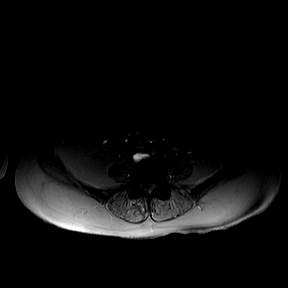
[im 3/9]
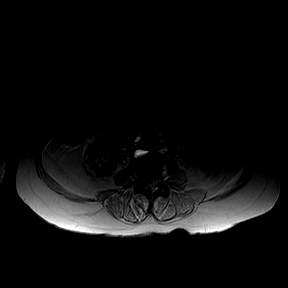
[im 6/9]
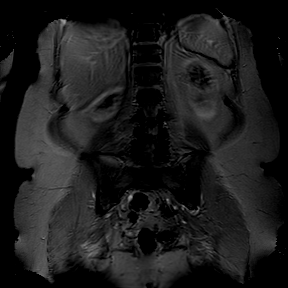
[im 9/9]
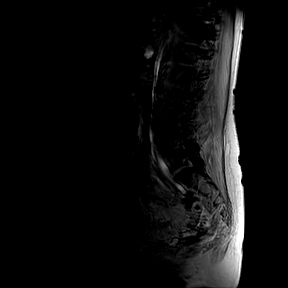

[Series 201: t2w_cor-surv · coronal · 6.0mm · 0.50mm/px · 2 of 5 slices shown]
[im 1/5]
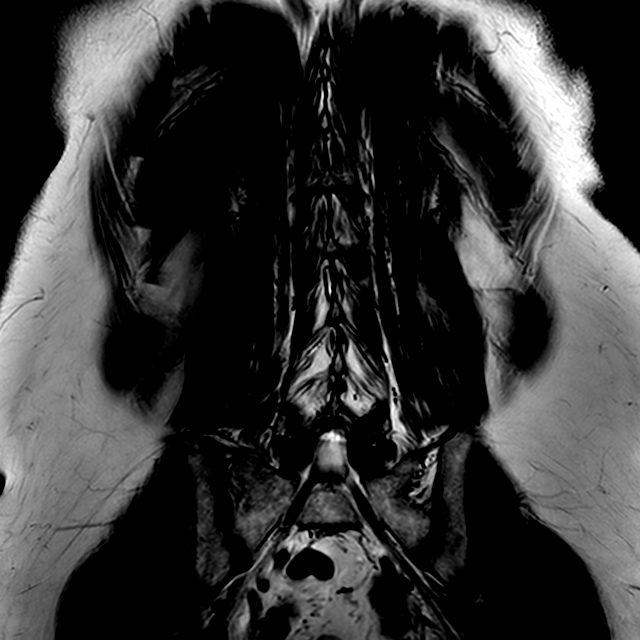
[im 5/5]
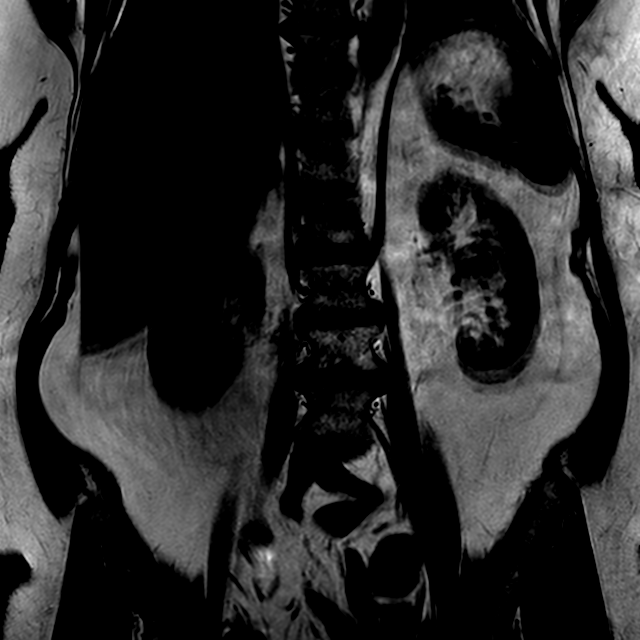

[Series 301: t2w_tse sag · sagittal · 4.0mm · 0.31mm/px · 5 of 17 slices shown]
[im 1/17]
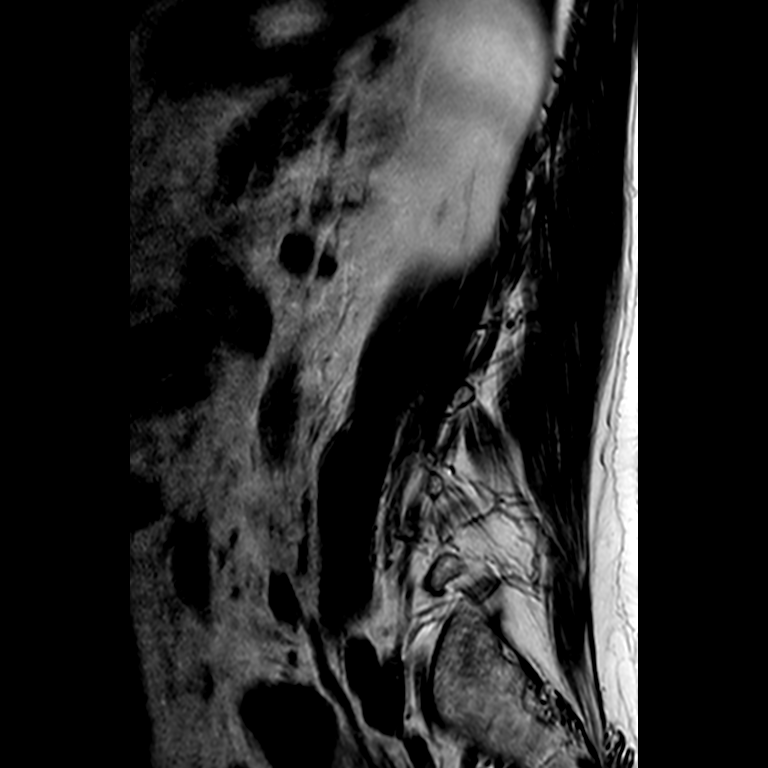
[im 3/17]
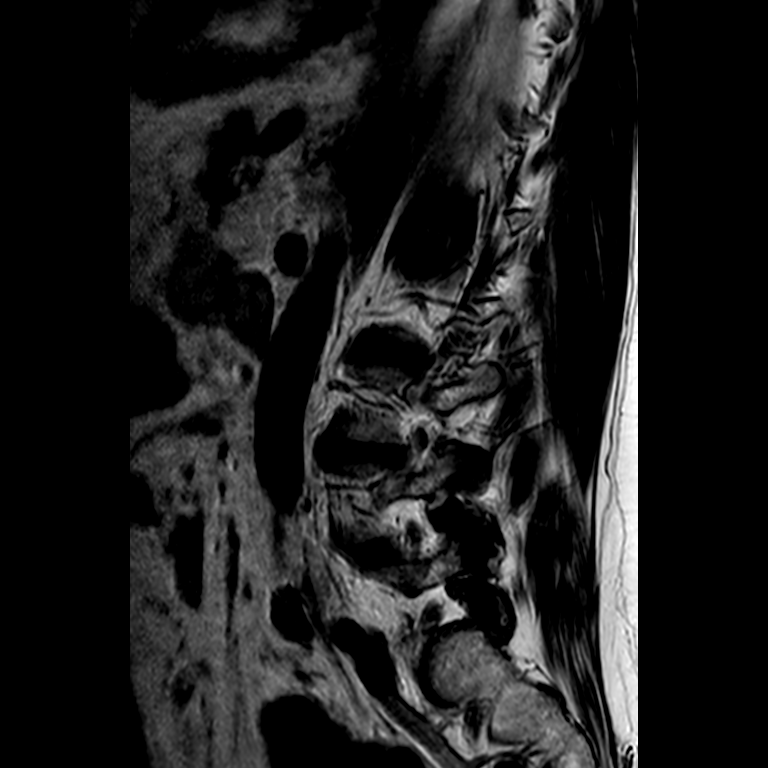
[im 5/17]
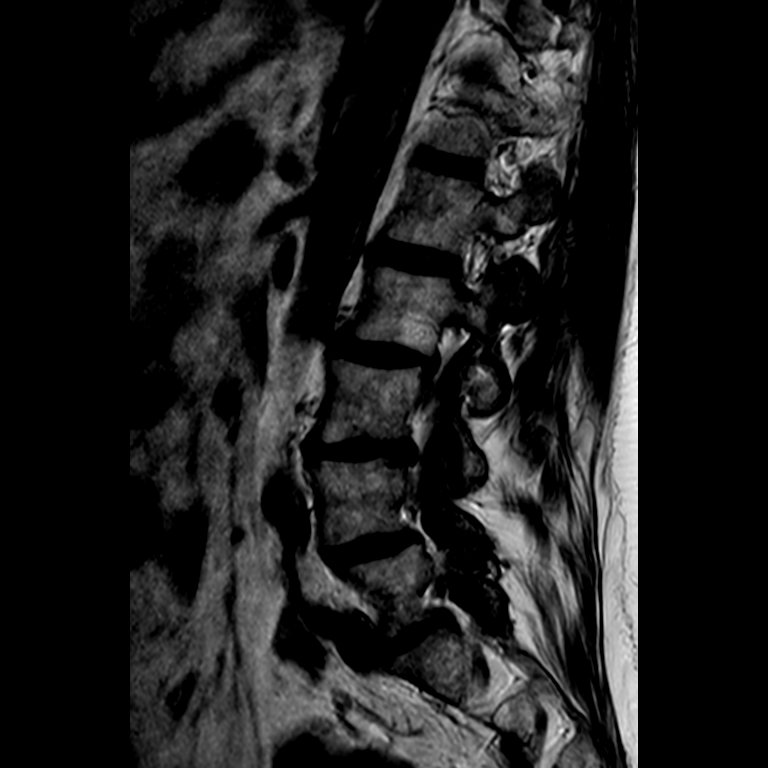
[im 9/17]
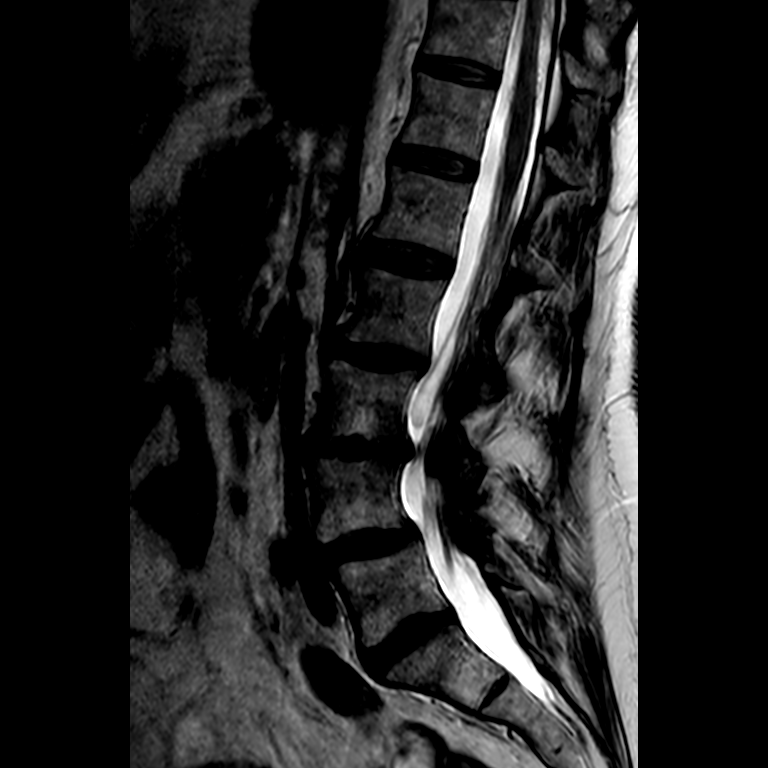
[im 15/17]
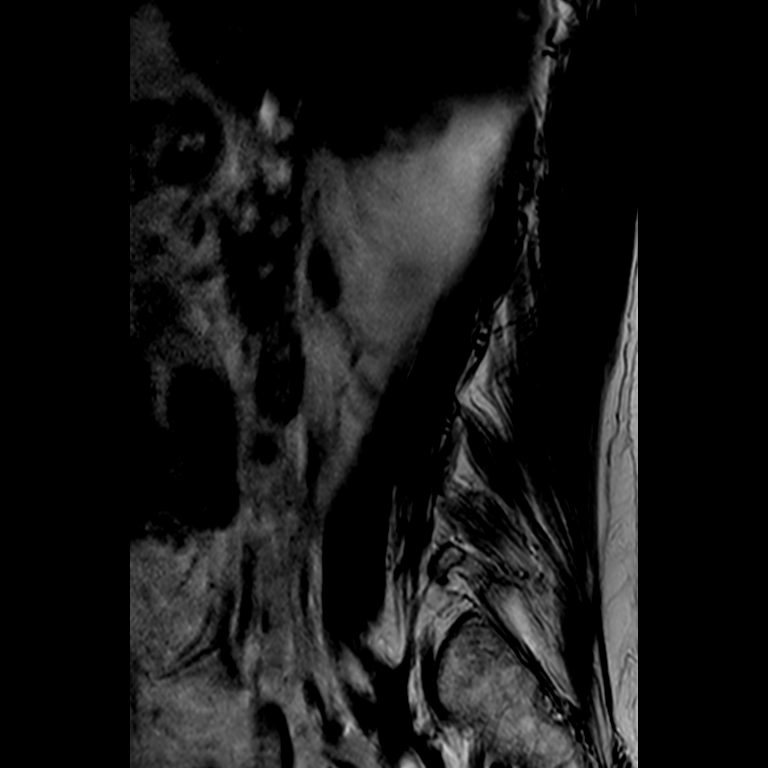

[Series 401: t1w_tse sag · sagittal · 4.0mm · 0.50mm/px · 3 of 17 slices shown]
[im 3/17]
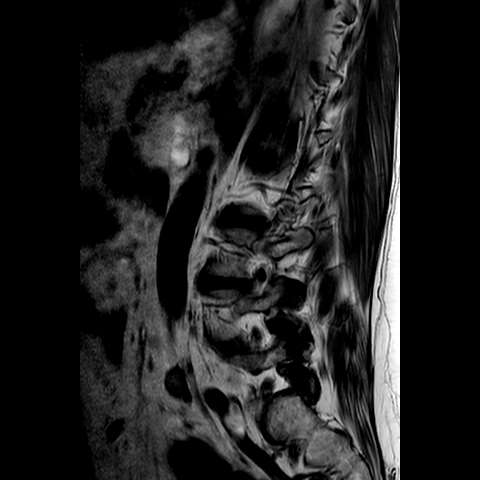
[im 9/17]
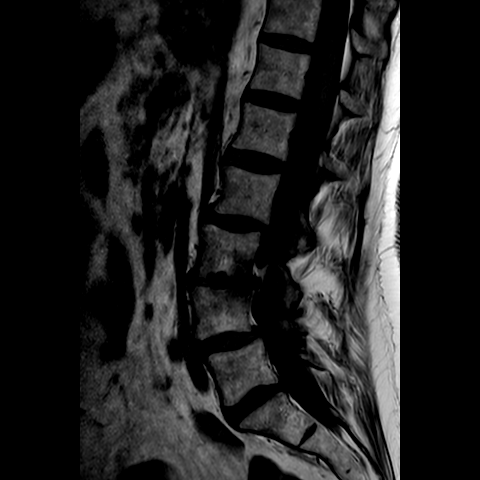
[im 15/17]
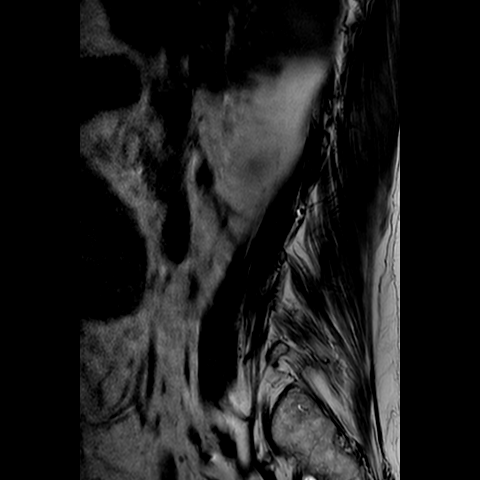

[14 of 48 positions shown; findings below may reference images not displayed]

FINDINGS: There is a mild leftward curvature of the lumbar spine centered at the L3 level. 
There is retrolisthesis of L2 on L3 by 1 to 2 mm. Similarly there is very 
minimal retrolisthesis at L3-L4 by 1 to 2 mm. There is anterolisthesis of L4 on 
L5 by 2 mm. No additional site listhesis. 
There are opposing Modic type I endplate changes at L3-L4. There is no acute 
compression 40. There is no discrete suspicious osseous lesion. 
The conus terminates at the level of the L1 vertebral body. Signal within the 
distal cord and conus is normal. 
T12-L1: No significant disc abnormality, spinal canal, or foraminal narrowing. 
L1-L2: No significant disc internally, spinal canal, or foraminal narrowing. 
L2-L3: Mild disc osteophyte complex indents the ventral aspect of the thecal sac 
without significant spinal canal narrowing. There is mild facet degenerative 
change. Mild right and no significant left foraminal narrowing. 
L3-L4: Osteophyte complex indents the ventral aspect of the thecal sac and 
results in mild spinal canal narrowing. There is moderate right and mild left 
foraminal narrowing at this level. Mild facet degenerative change. 
L4-L5: Disc bulge is present at which indents the ventral aspect of thecal sac 
but results in no significant spinal canal narrowing. There is mild right and 
mild-to-moderate left foraminal narrowing at this level. There is mild facet 
degenerative change. 
L5-S1: Disc bulge present as well as mild facet degenerative change. No 
significant spinal canal narrowing. There is mild left and no significant right 
foraminal narrowing.
IMPRESSION: 1.  Mild to moderate multilevel lumbar spondylolisthesis as detailed above. No 
acute findings. 
2.  Right foraminal narrowing at L3-L4 that is moderate in severity. Finding 
could result in right lower extremity radicular symptoms. 
3.  Mild to moderate left foraminal narrowing at L4-L5. 
4.  No high-grade spinal canal narrowing.

## 2020-06-22 IMAGING — DX LUMBAR SPINE COMPLETE 4 VIEWS
1 series · 4 of 4 positions shown · non-contrast
Comparison: None.

LUMBAR SPINE COMPLETE 4 VIEWS, 06/22/2020 [DATE]: 
CLINICAL INDICATION: Chronic low back pain radiating into the RIGHT leg.

[Series 1: AP · U · 0.14mm/px · 4 of 4 slices shown]
[im 1/4]
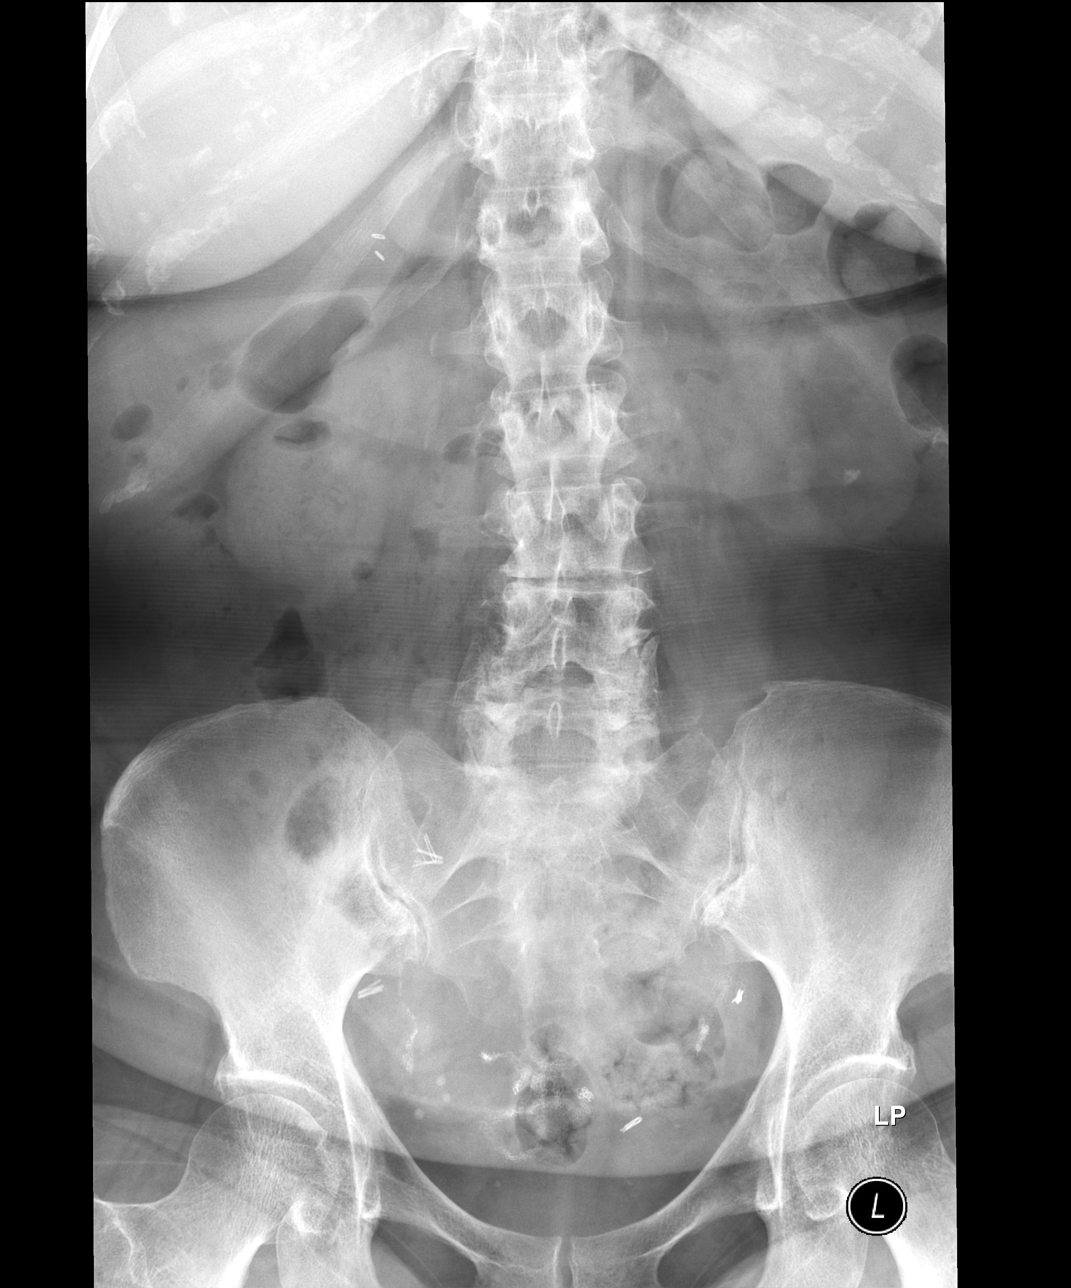
[im 2/4]
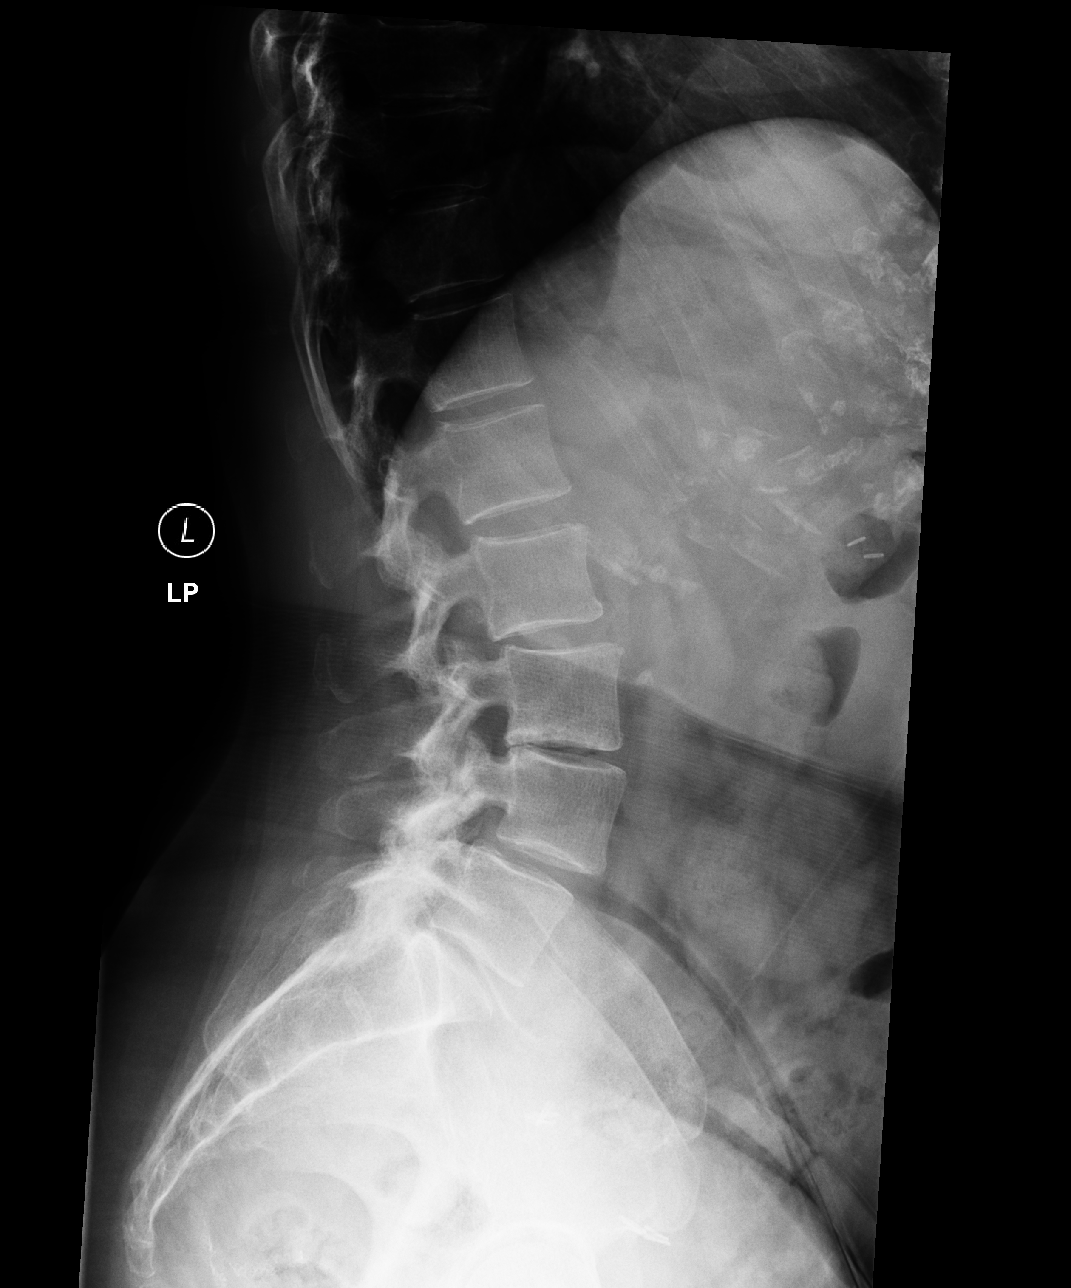
[im 3/4]
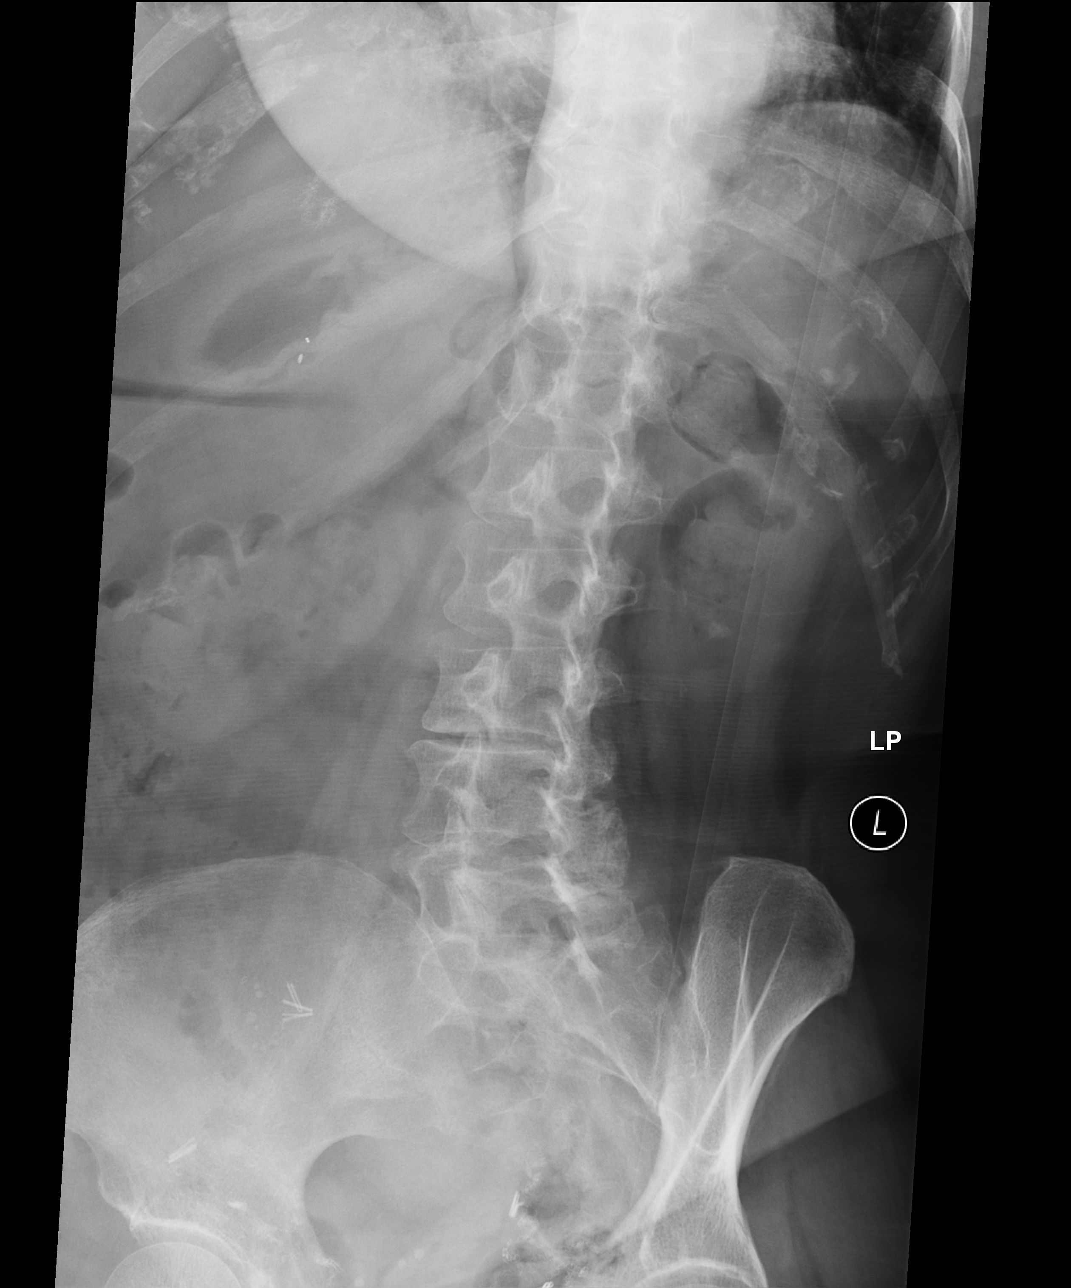
[im 4/4]
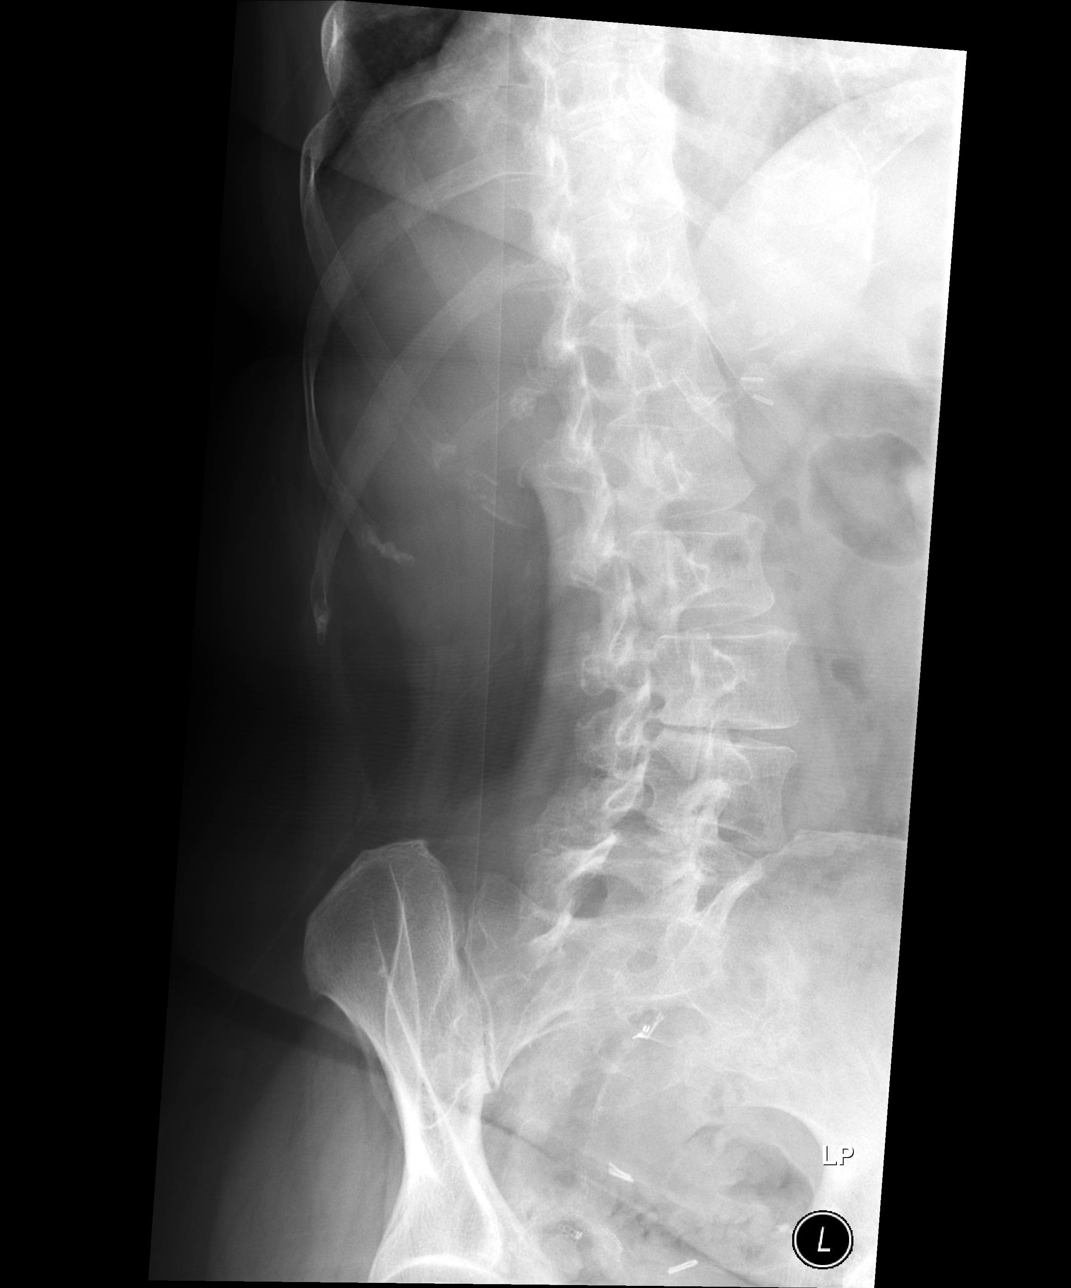

[4 of 4 positions shown; findings below may reference images not displayed]

FINDINGS: The AP view reveals that there is a mild degree of levoscoliotic curvature 
centered at L3 and L4. The lateral projection shows that there is mild 
degenerative anterolisthesis between the L4-L5 vertebral bodies of approximately 
6 mm. There is mild degenerative retrolisthesis at L2-3 of approximately 5 mm. 
Alignment is otherwise unremarkable. All of the vertebral body heights are 
normal. There is mild disc space narrowing at L2-3, moderate to severe disc 
space narrowing at L3-4, mild to moderate disc space narrowing at L4-5 and 
moderate disc space narrowing at L5-S1. Moderate to severe degenerative changes 
are present in the facet joints bilaterally at L4-5 and L5-S1. No lytic or 
sclerotic lesions are detected. 
There are surgical clips bilaterally in the pelvis and in the RIGHT upper 
quadrant. There is are calculi in the lower pole of the LEFT kidney. Radiopaque 
suture material is in the pelvis.
IMPRESSION: 1. Degenerative changes in the lumbar spine as described above. No evidence for 
compression fracture. 
2. Left renal nephrolithiasis.

## 2020-09-05 IMAGING — MR MRI ABDOMEN W/WO CONTRAST
14 of 16 series · 39 of 48 positions shown · non-contrast
Comparison: CT abdomen March 03, 2020 and MRI abdomen August 23, 2019.

MRI ABDOMEN W/WO CONTRAST, 09/05/2020 [DATE]: 
CLINICAL INDICATION:  Prior history of colorectal malignancy with hepatic 
metastasis. Patient is status post surgical LEFT hepatectomy.
TECHNIQUE: Multiplanar, multiecho position MR images of the abdomen attention 
liver were performed with and without intravenous contrast enhancement.  7 cc 
Gadovist injected intravenously at 1.5 cc/s.

[Series 201: survey navi · axial · 15.0mm · 1.56mm/px · z∈[+176,+269]mm · 2 of 11 slices shown]
[im 1/11]
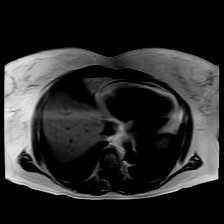
[im 11/11]
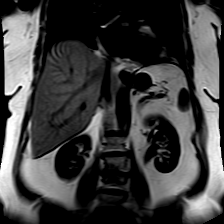

[Series 402: sout of phase · axial · 6.0mm · 1.15mm/px · z∈[+7,+238]mm · 2 of 34 slices shown]
[im 1/34]
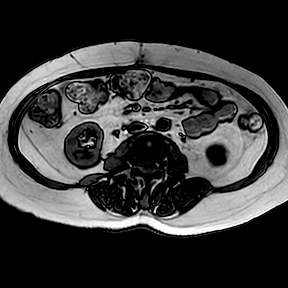
[im 34/34]
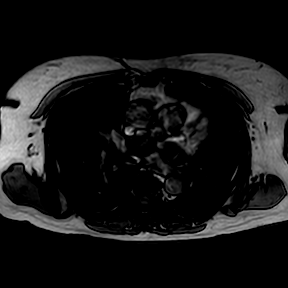

[Series 501: t2w_tse-navi · axial · 6.0mm · 0.74mm/px · z∈[+3,+248]mm · 2 of 36 slices shown]
[im 1/36]
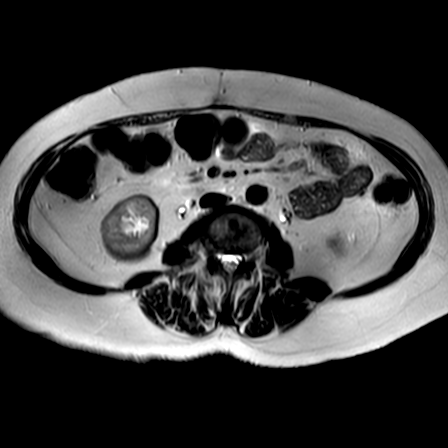
[im 36/36]
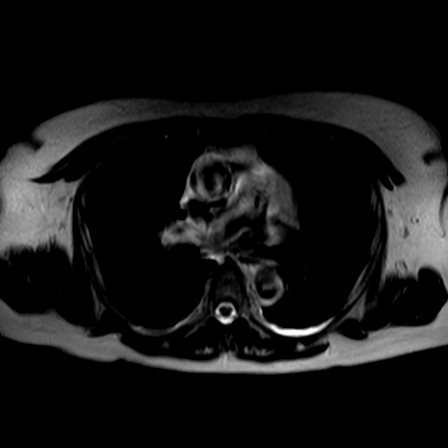

[Series 601: t2w_spair-navi · axial · 6.0mm · 0.86mm/px · 1 of 36 slices shown]
[im 1/36]
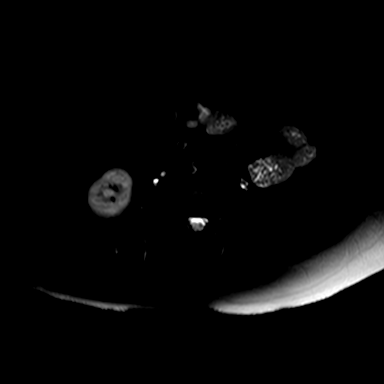

[Series 701: dwi_3b_nav · axial · 5.0mm · 1.56mm/px · z∈[+5,+247]mm · 3 of 90 slices shown]
[im 1/90]
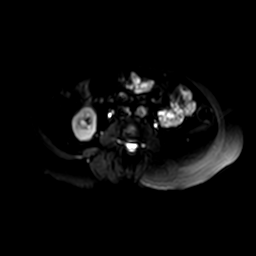
[im 45/90]
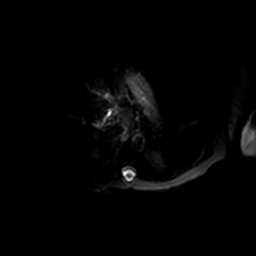
[im 90/90]
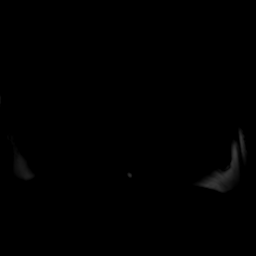

[Series 702: dadc 600 · axial · 5.0mm · 1.56mm/px · z∈[+5,+247]mm · 2 of 45 slices shown]
[im 1/45]
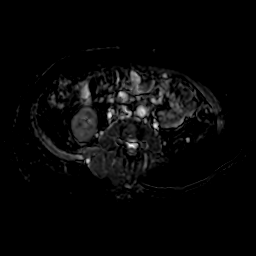
[im 45/45]
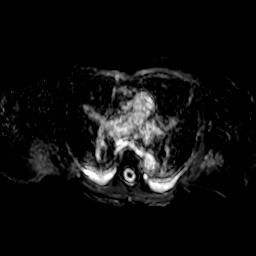

[Series 703: sb0 · axial · 5.0mm · 1.56mm/px · z∈[+5,+247]mm · 2 of 45 slices shown]
[im 1/45]
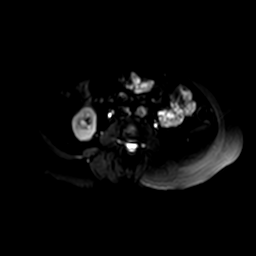
[im 45/45]
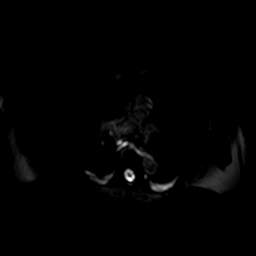

[Series 704: (id) · axial · 5.0mm · 1.56mm/px · z∈[+5,+247]mm · 2 of 45 slices shown]
[im 1/45]
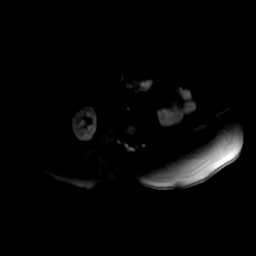
[im 45/45]
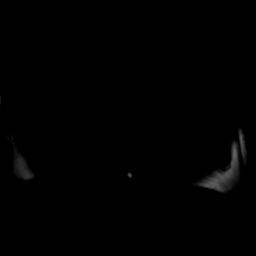

[Series 802: DIXON · axial · 4.0mm · 0.86mm/px · z∈[-3,+245]mm · 4 of 125 slices shown (1 of 5)]
[im 1/125]
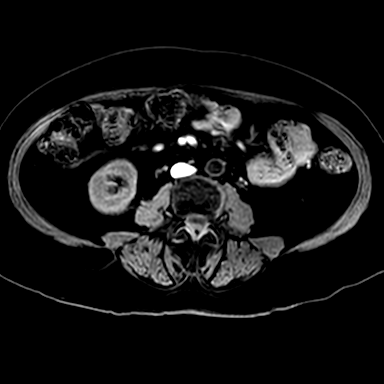
[im 42/125]
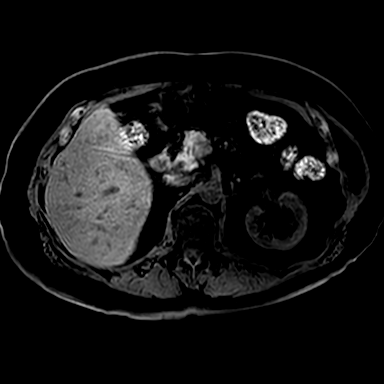
[im 83/125]
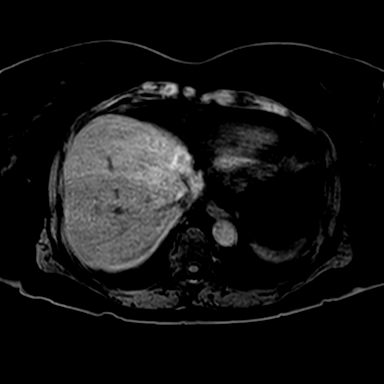
[im 125/125]
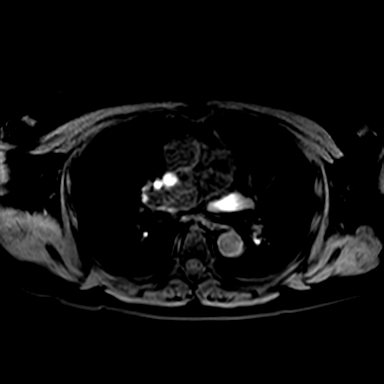

[Series 803: DIXON · axial · 4.0mm · 0.86mm/px · z∈[-3,+245]mm · 4 of 125 slices shown (2 of 5)]
[im 1/125]
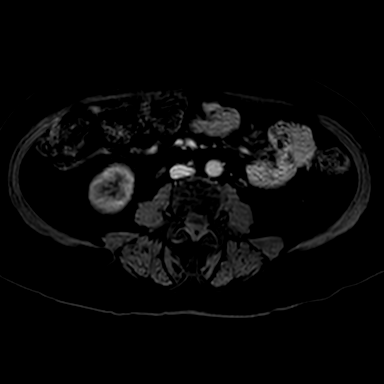
[im 42/125]
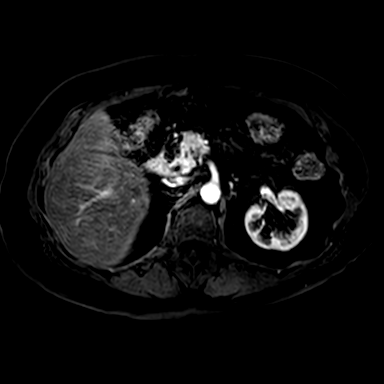
[im 83/125]
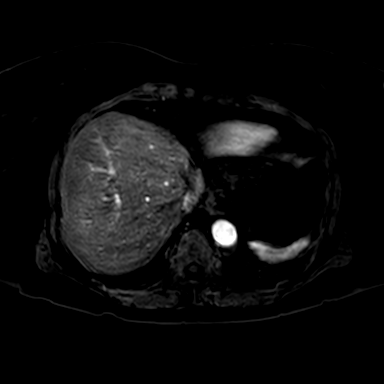
[im 125/125]
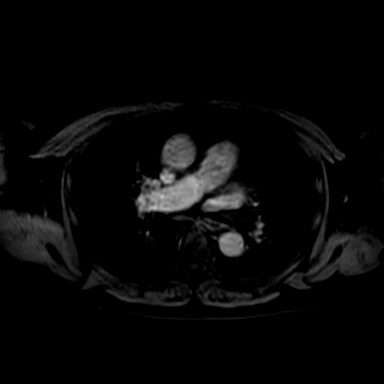

[Series 804: DIXON · axial · 4.0mm · 0.86mm/px · z∈[-3,+245]mm · 4 of 125 slices shown (3 of 5)]
[im 1/125]
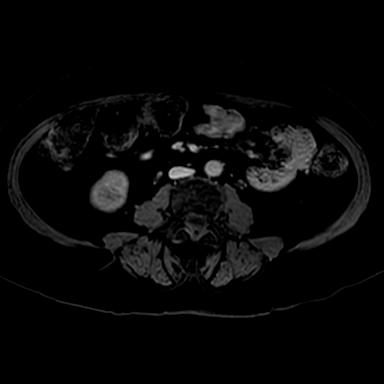
[im 42/125]
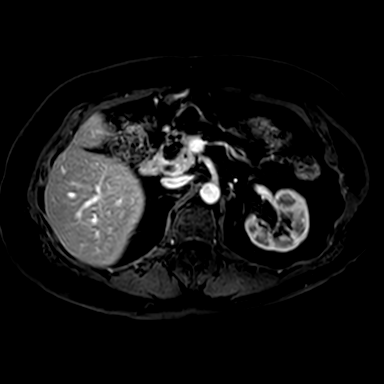
[im 83/125]
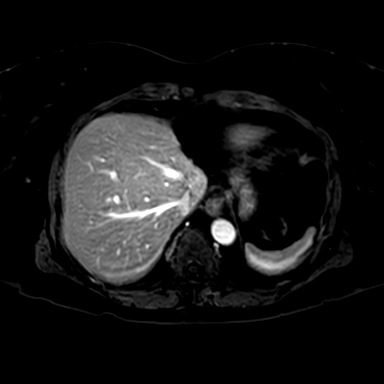
[im 125/125]
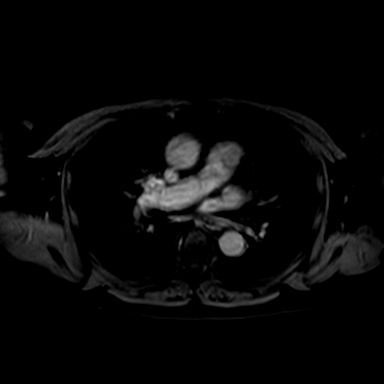

[Series 805: DIXON · axial · 4.0mm · 0.86mm/px · z∈[-3,+245]mm · 4 of 125 slices shown (4 of 5)]
[im 1/125]
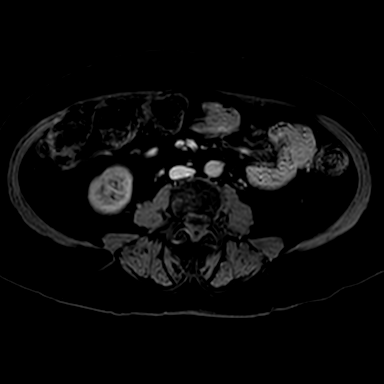
[im 42/125]
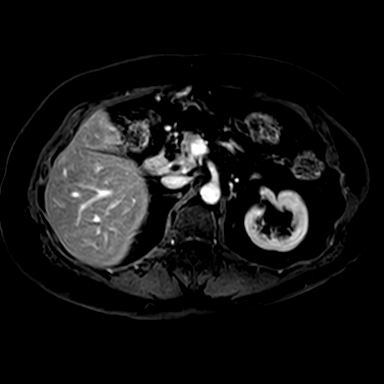
[im 83/125]
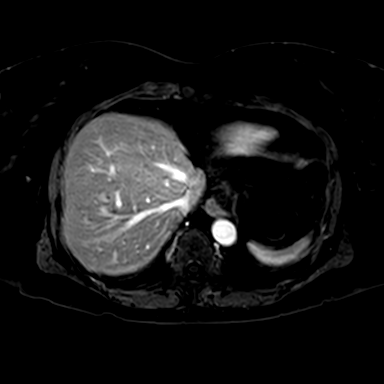
[im 125/125]
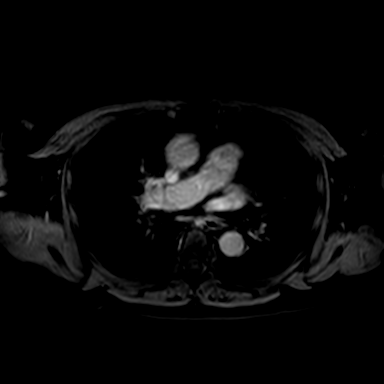

[Series 806: DIXON · axial · 4.0mm · 0.86mm/px · z∈[-3,+245]mm · 4 of 125 slices shown (5 of 5)]
[im 1/125]
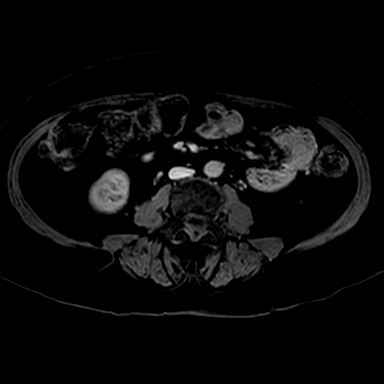
[im 42/125]
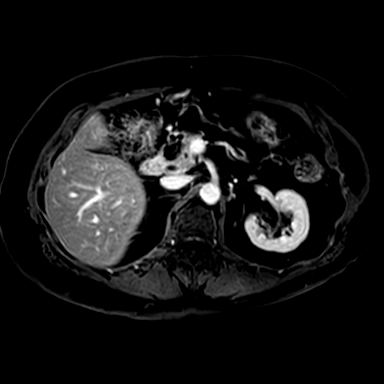
[im 83/125]
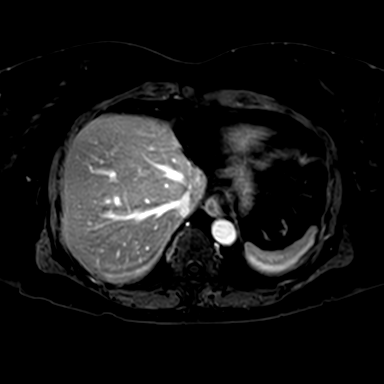
[im 125/125]
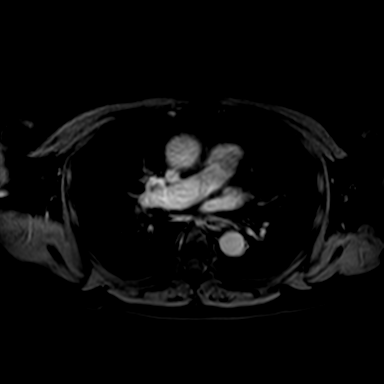

[Series 807: DIXON post-contrast · axial · 4.0mm · 0.86mm/px · z∈[-3,+161]mm · 3 of 125 slices shown]
[im 1/125]
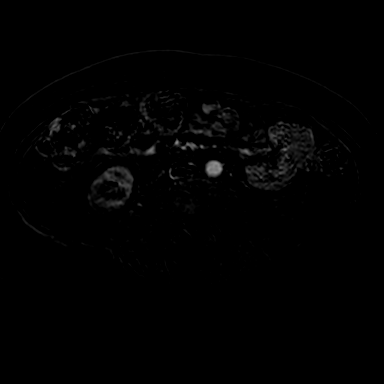
[im 42/125]
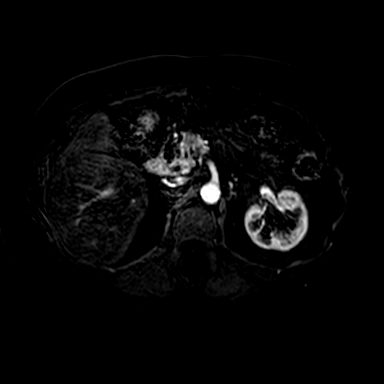
[im 83/125]
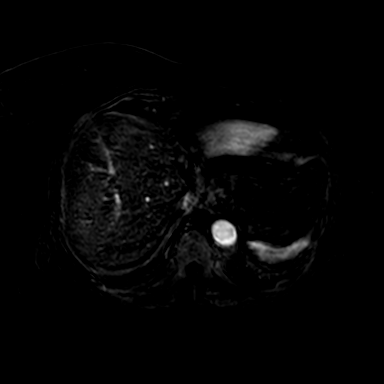

[39 of 48 positions shown; findings below may reference images not displayed]

FINDINGS: Left hepatectomy is again noted. The remaining RIGHT lobe of the liver shows no 
suspicious focal abnormality. Portal vein and hepatic vein remain patent. There 
is evidence of diffuse fatty infiltration of the remaining liver. No 
abnormalities on the diffusion sequences. The pancreas is unremarkable except 
for focal fatty infiltration the head. The pleura tract appears normal. The 
gallbladder is absent. The spleen is within normal limits. The adrenal glands 
and kidneys are without likely significant pathology. Incidental duplication of 
the LEFT renal collecting system is present. The incidentally imaged portions of 
the GI tract are unremarkable. Degenerative changes are in the spine. There is 
no ascites in the abdomen. Abdominal aorta is normal in caliber. No enlarged 
abdominal lymph nodes are detected. An umbilical hernia is partially visible on 
coronal images. There is a lipoma between the LEFT upper quadrant abdominal wall 
muscles which is stable.
IMPRESSION: Stable exam without evidence for hepatic metastasis or other concerning 
findings.

## 2020-09-18 IMAGING — CT CT CHEST WITHOUT CONTRAST
2 of 3 series · 15 of 36 positions shown, 18 images · non-contrast
Comparison: 03/03/2020

CT CHEST WITHOUT CONTRAST, 09/18/2020 [DATE]: 
CLINICAL INDICATION:  Colon carcinoma 
A search for DICOM formatted images was conducted for prior CT imaging studies 
completed at a non-affiliated media free facility.
TECHNIQUE: The chest was scanned from base of neck through the lung bases 
without contrast on a high resolution low dose CT scanner. Routine MPR and MIP 
3D renderings were reconstructed on an independent workstation with concurrent 
physician supervision.

[Series 2: chest 2.0 i31s 3 · axial · 0.71mm/px · z∈[+811,+1073]mm · 12 of 155 slices shown, 15 images]
[im 12/155  mediastinal]
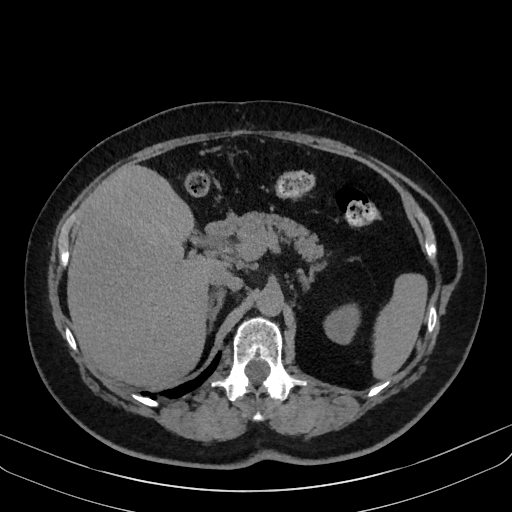
[im 12/155  lung]
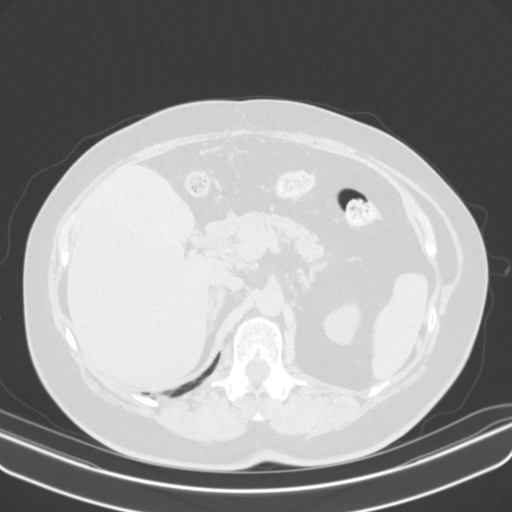
[im 23/155  lung]
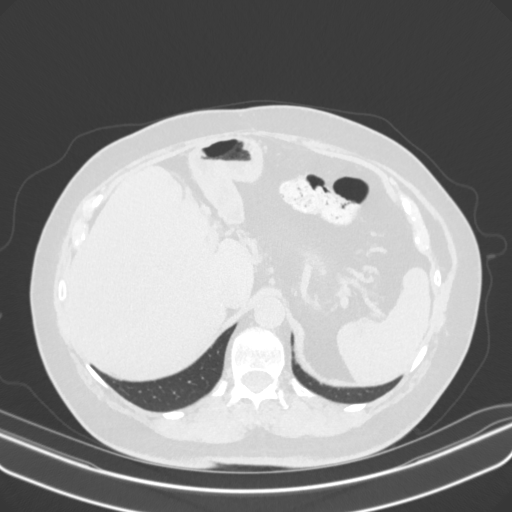
[im 35/155  lung]
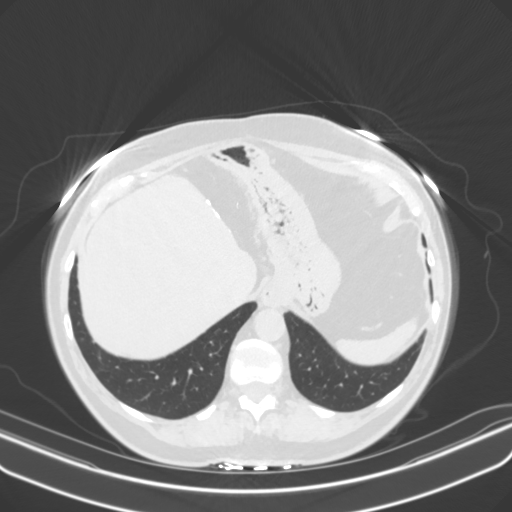
[im 46/155  lung]
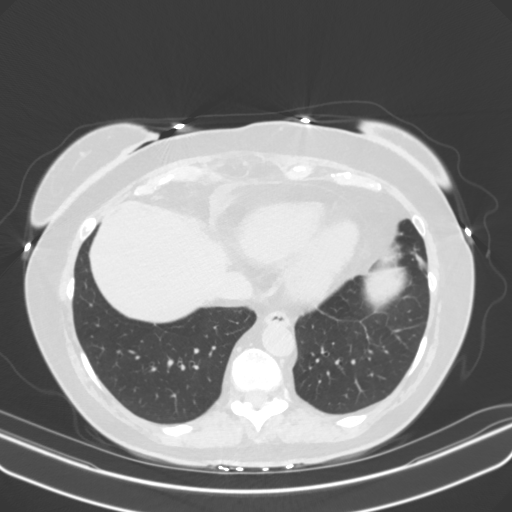
[im 58/155  mediastinal]
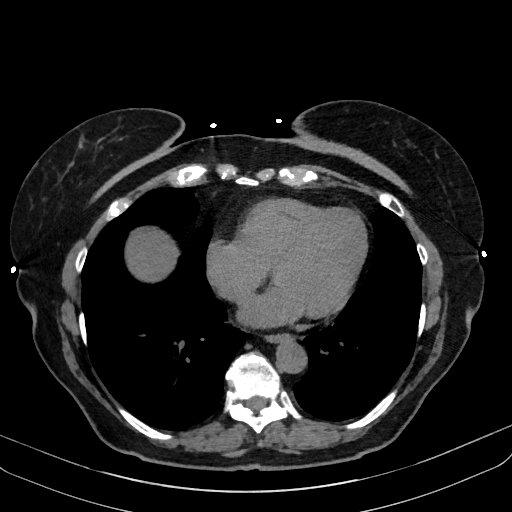
[im 58/155  lung]
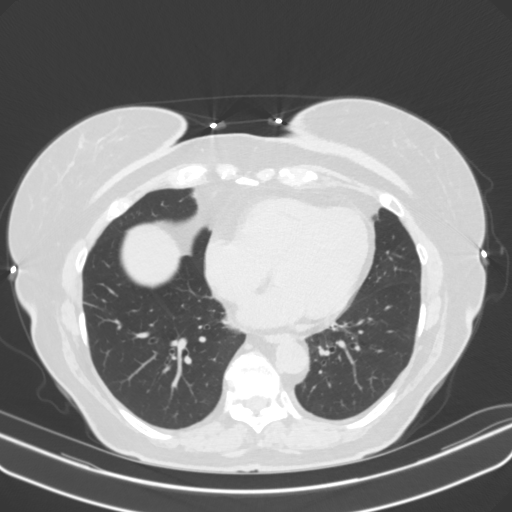
[im 69/155  lung]
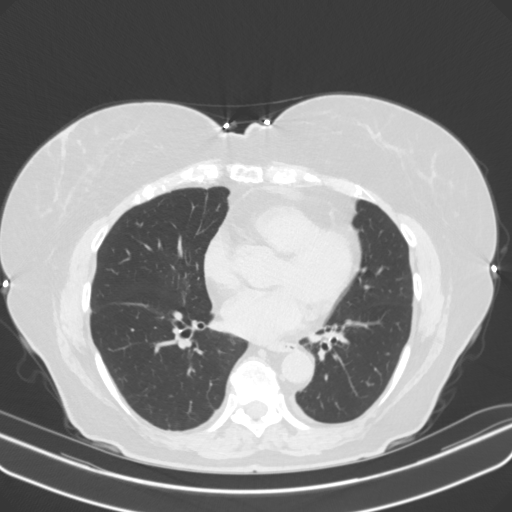
[im 86/155  lung]
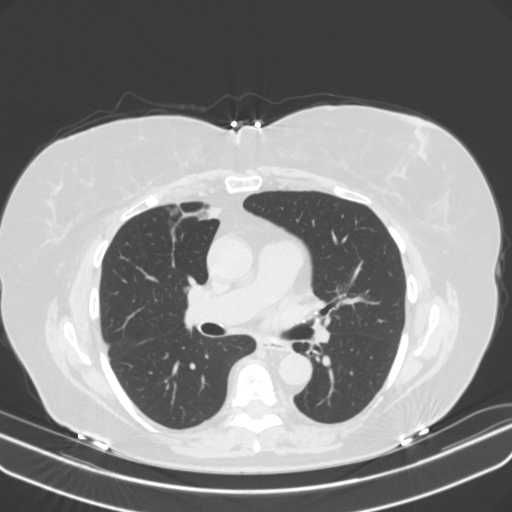
[im 97/155  lung]
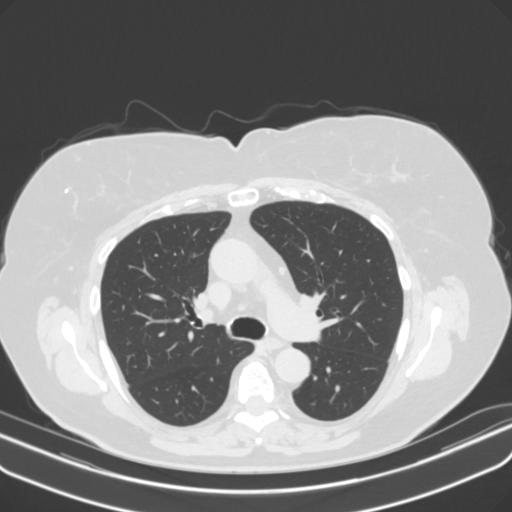
[im 109/155  mediastinal]
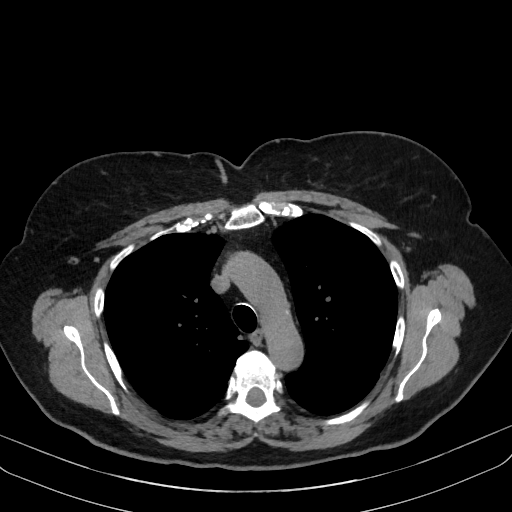
[im 109/155  lung]
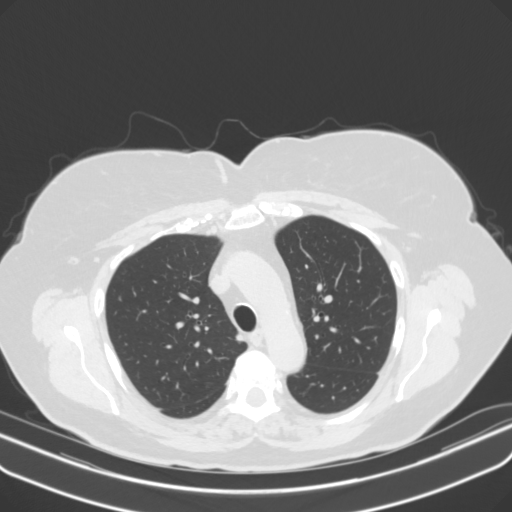
[im 120/155  lung]
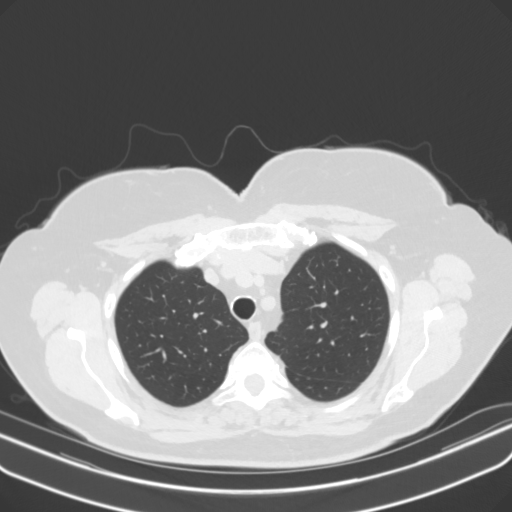
[im 132/155  lung]
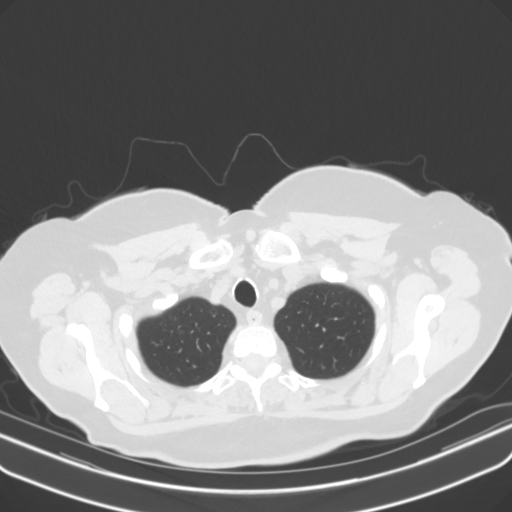
[im 143/155  lung]
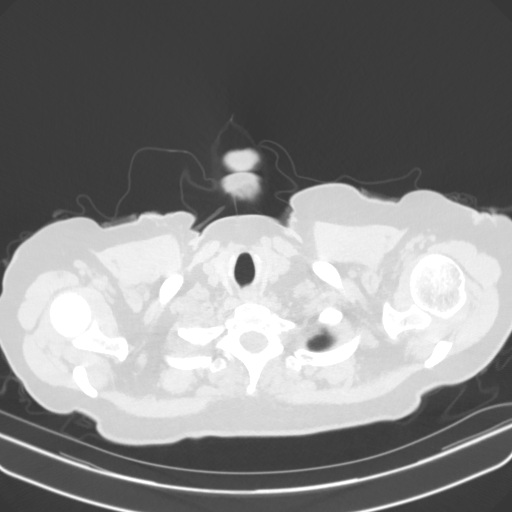

[Series 5: coronal · coronal · 0.62mm/px · 3 of 123 slices shown]
[im 25/123  lung]
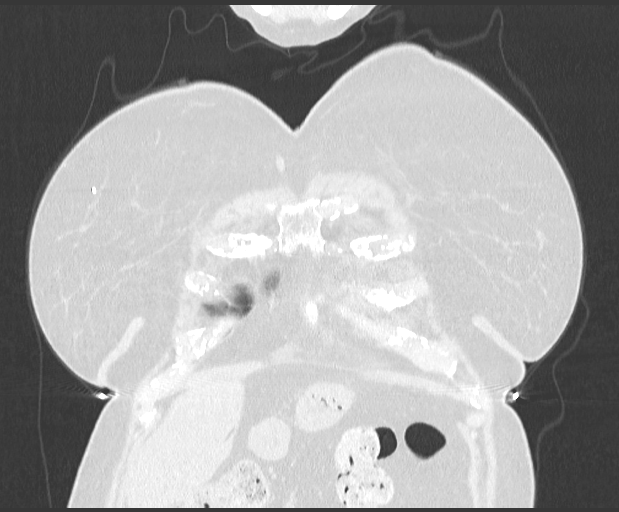
[im 49/123  lung]
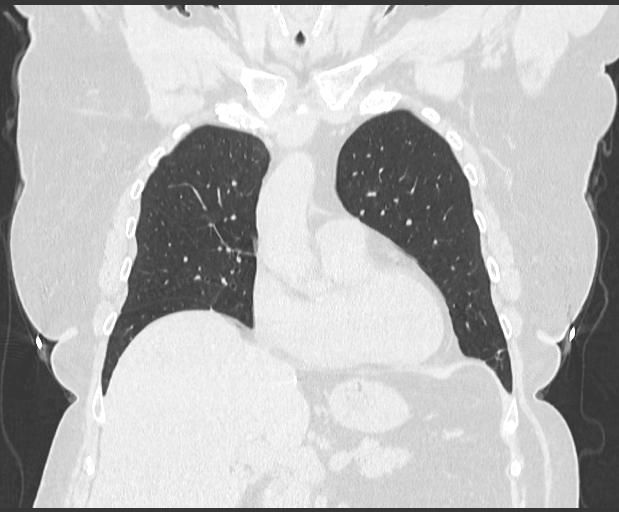
[im 74/123  lung]
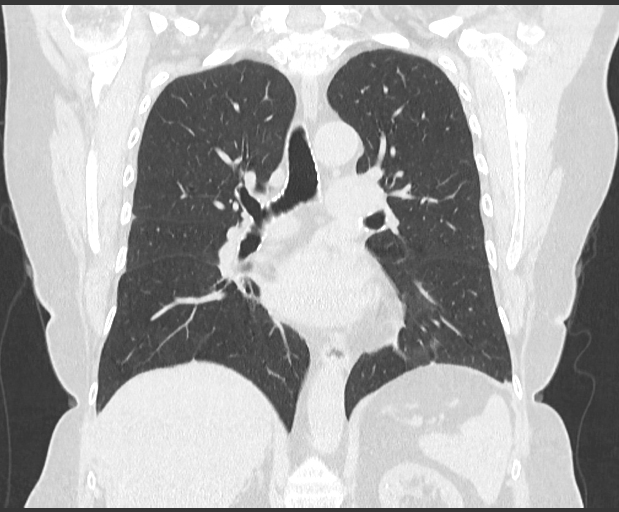

[15 of 36 positions shown; findings below may reference images not displayed]

FINDINGS: LUNGS AND PLEURA:  There is a band of chronic atelectasis in the medial segment 
of the right middle lobe has not changed relative to the comparison exam. No new 
abnormalities in the lung are exhibited. No pleural effusions or other signs of 
active pleural disease are apparent. 
MEDIASTINUM:  No adenopathy. Normal heart size. No pericardial effusion. 
CHEST WALL/AXILLA: No mass or adenopathy. 
UPPER ABDOMEN: There is evidence of a previous cholecystectomy. No acute 
abnormalities in the upper abdomen are observed. 
MUSCULOSKELETAL: No acute abnormalities are detected. There are degenerative 
changes in the thoracic spine.
IMPRESSION: No evidence of an acute pathologic process is revealed. Mild chronic atelectasis 
is exhibited in the right middle lobe. There has been a prior cholecystectomy. 
RADIATION DOSE REDUCTION: All CT scans are performed using radiation dose 
reduction techniques, when applicable.  Technical factors are evaluated and 
adjusted to ensure appropriate moderation of exposure.  Automated dose 
management technology is applied to adjust the radiation doses to minimize 
exposure while achieving diagnostic quality images.

## 2021-02-14 IMAGING — CT CT ABDOMEN AND PELVIS WITH IV AND ORAL CONT
2 of 3 series · 15 of 46 positions shown, 17 images · IV contrast (APPLIED)
Comparison: 03/03/2020

________________________________________________________________________________________________ 
CT ABDOMEN AND PELVIS WITH IV AND ORAL CONT, 02/14/2021 [DATE]: 
CLINICAL INDICATION: Colon resection and partial liver resection for colon 
carcinoma 
A search for DICOM formatted images was conducted for prior CT imaging studies 
completed at a non-affiliated media free facility.
TECHNIQUE: The abdomen and pelvis were scanned from lung bases through the 
pubic rami with 100 mL of Isovue 300 contrast on a high-resolution CT scanner 
using dose reduction techniques. Patient drank diluted oral contrast for this 
study. Routine MPR reconstructions were performed.

[Series 4: abd/pel ax w · axial · 0.82mm/px · z∈[-434,-41]mm · 12 of 151 slices shown, 14 images]
[im 10/151  soft-tissue]
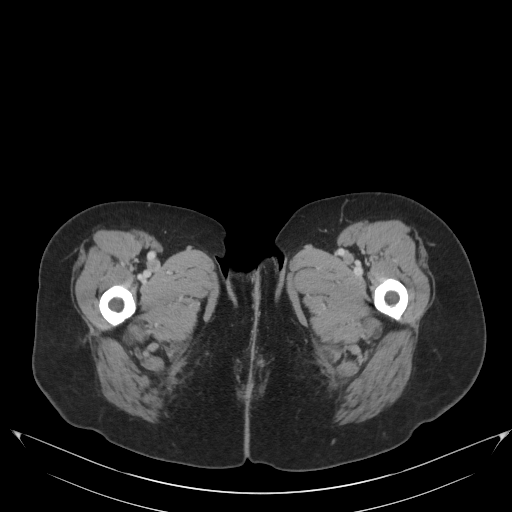
[im 10/151  bone]
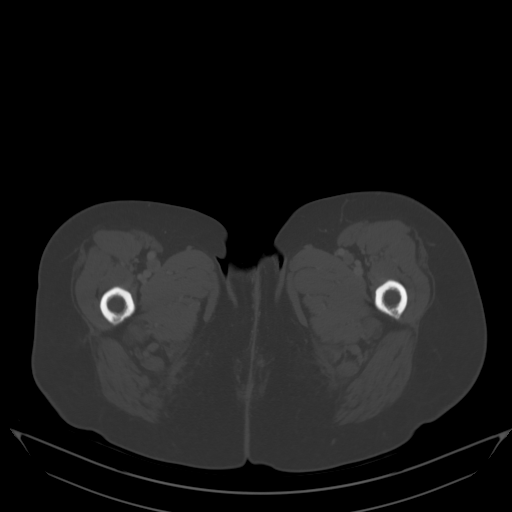
[im 20/151  soft-tissue]
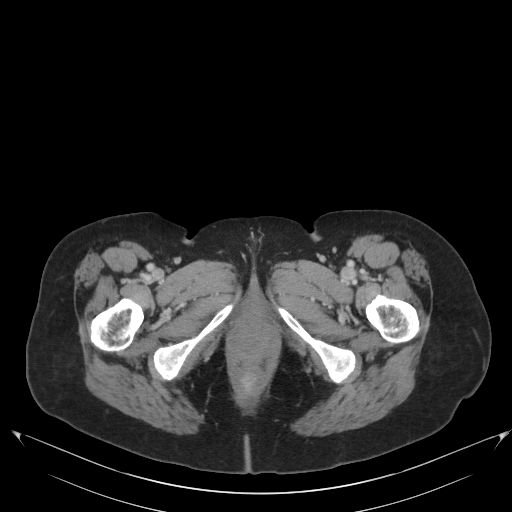
[im 34/151  soft-tissue]
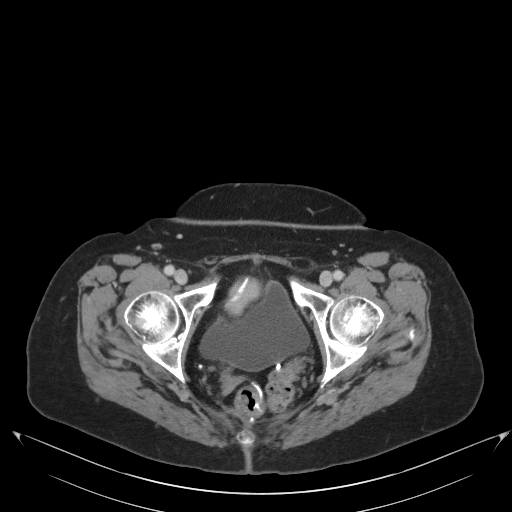
[im 44/151  soft-tissue]
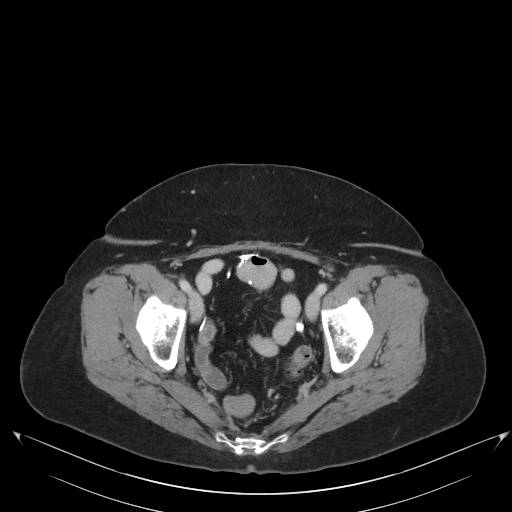
[im 59/151  soft-tissue]
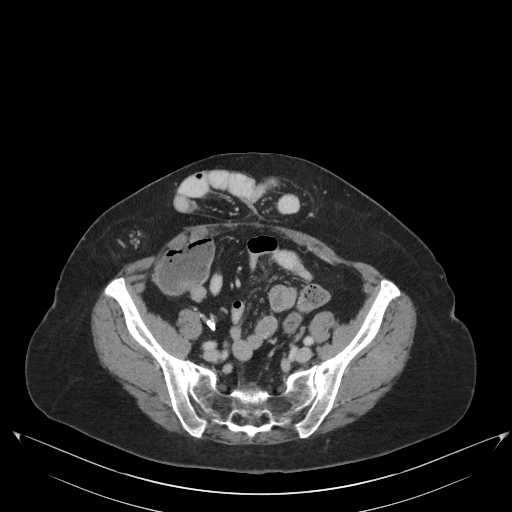
[im 68/151  soft-tissue]
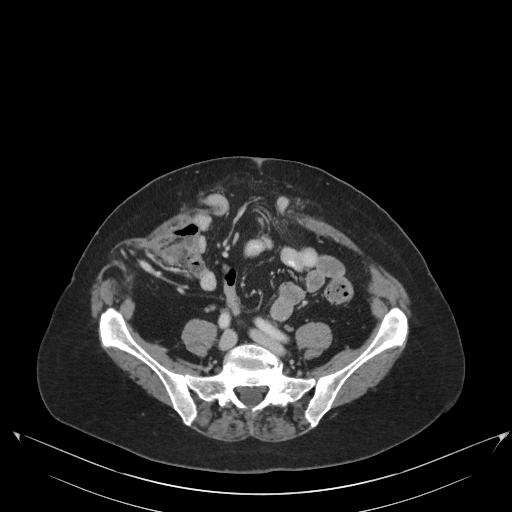
[im 83/151  soft-tissue]
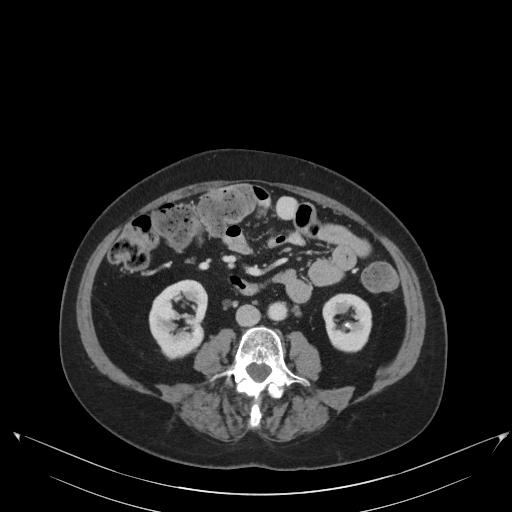
[im 92/151  soft-tissue]
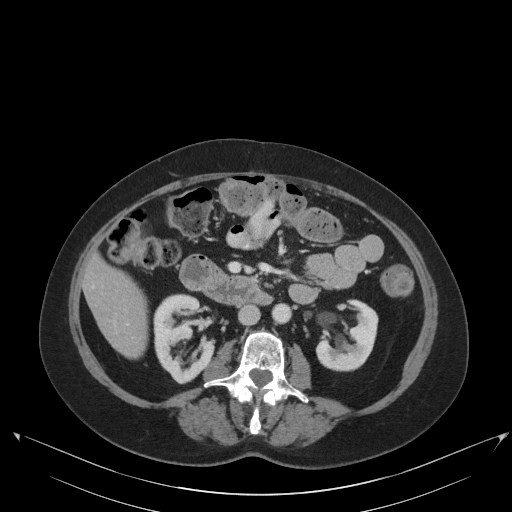
[im 107/151  soft-tissue]
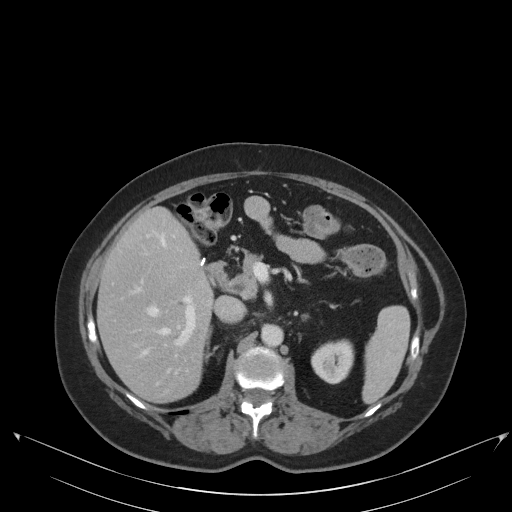
[im 107/151  bone]
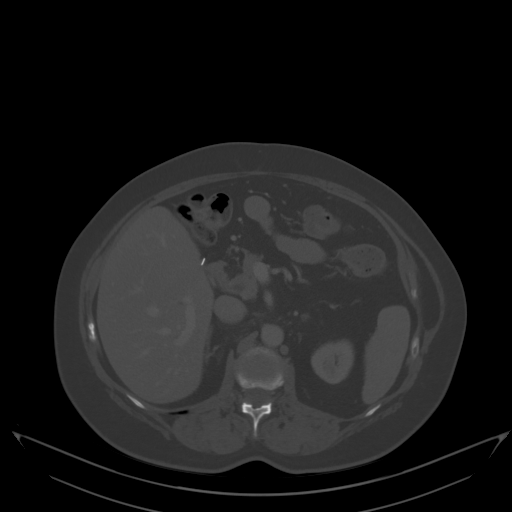
[im 117/151  soft-tissue]
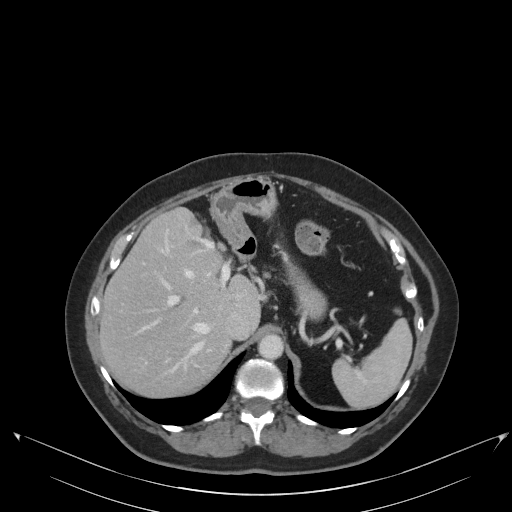
[im 131/151  soft-tissue]
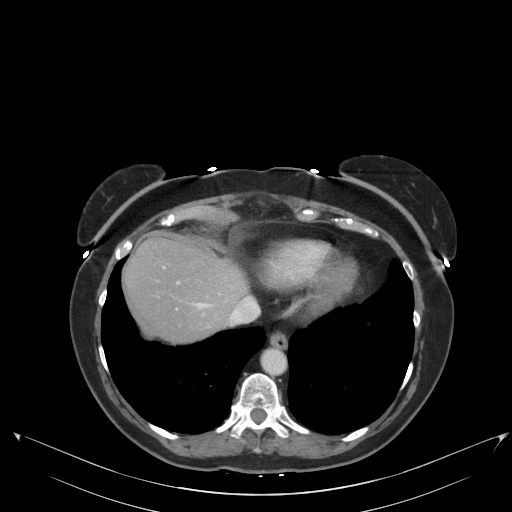
[im 141/151  soft-tissue]
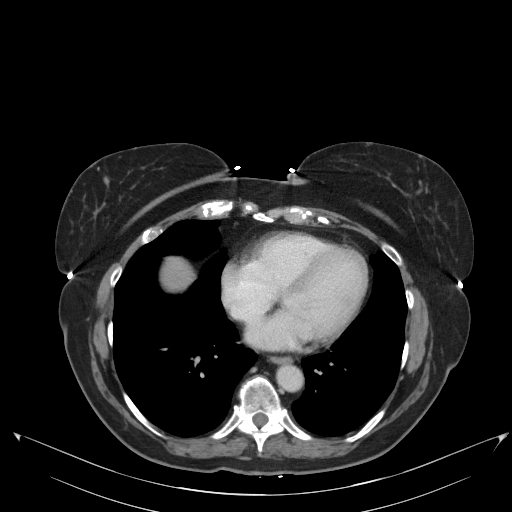

[Series 5: abd/pel cor w · coronal · 0.78mm/px · 3 of 150 slices shown]
[im 50/150  soft-tissue]
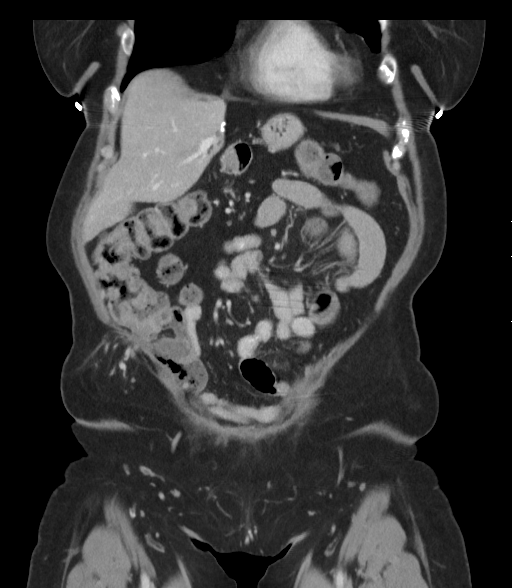
[im 67/150  soft-tissue]
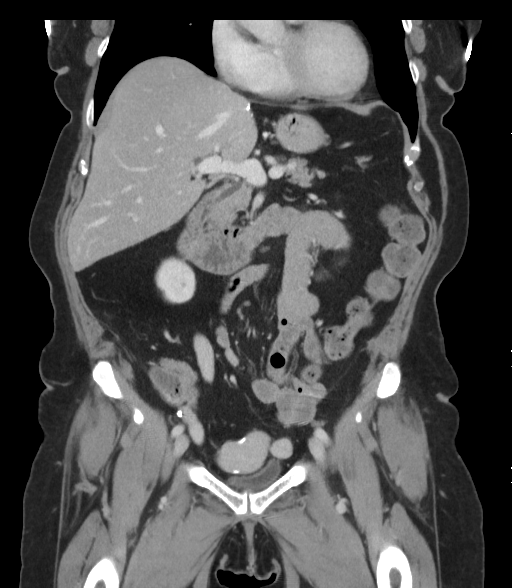
[im 83/150  soft-tissue]
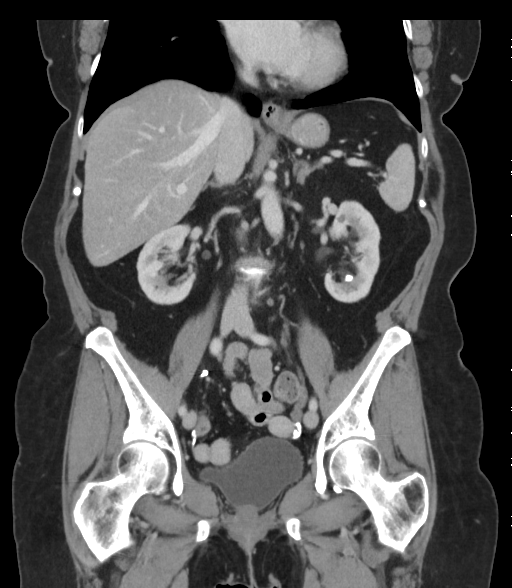

[15 of 46 positions shown; findings below may reference images not displayed]

FINDINGS: LUNG BASES: No significant abnormalities are apparent at the lung bases. 
HEPATOBILIARY: There has been a left lobe hepatic resection. The gallbladder is 
surgically absent as well. The remainder of the liver parenchyma appears normal. 
No biliary ductal dilatation is exhibited. 
SPLEEN: No abnormalities in the spleen are observed. 
PANCREAS: The pancreas appears to be within normal limits.   
ADRENALS: No adrenal gland abnormalities are apparent. 
GENITOURINARY: There is a 6 x 8 mm calculus in one of the lower pole left renal 
calyces. There is mild fullness of the left renal collecting system, but the 
ureter is nondilated. No abnormalities in the right kidney are detected. No 
abnormalities in the urinary bladder are identified. There has been a previous 
hysterectomy. 
LYMPH NODES: No lymphadenopathy is revealed. 
STOMACH, SMALL BOWEL AND COLON: There is evidence of a prior sigmoid colon 
resection. 
VASCULAR STRUCTURES: No significant vascular abnormalities are identified.  
MUSCULOSKELETAL: There is a ventral hernia in the anterior pelvic wall. The 
defect measures 5.6 cm in diameter. Fat and a loop of small bowel have herniated 
through the defect into the subcutaneous fat, but no evidence of obstruction is 
observed. There is also a defect in the right lateral wall of the pelvic 
musculature measuring 2.8 cm, through which the pelvic fat has herniated. There 
are mild to moderate degenerative changes in the lumbar spine. 
ADDITIONAL FINDINGS: None.
IMPRESSION: No evidence of recurrent malignancy is exhibited. 
There is a ventral hernia below the umbilicus containing intra-abdominal fat and 
a loop of nonobstructed small bowel. 
There is a right spigelian hernia containing intrapelvic fat. 
Left nephrolithiasis with mild fullness of the left renal collecting system and 
renal pelvis, but not the left ureter which may indicate a mild chronic UPJ 
obstruction. 
Previous left lobe liver resection. 
Prior cholecystectomy. 
Previous sigmoid colon resection. 
RADIATION DOSE REDUCTION: All CT scans are performed using radiation dose 
reduction techniques, when applicable.  Technical factors are evaluated and 
adjusted to ensure appropriate moderation of exposure.  Automated dose 
management technology is applied to adjust the radiation doses to minimize 
exposure while achieving diagnostic quality images.

## 2021-05-28 IMAGING — MR MRI ABDOMEN AND PELVIS W/WO CONTRAST
13 of 22 series · 26 of 48 positions shown · IV contrast (gadolinium)
Comparison: Prior abdomen MRI September 05, 2020. CT abdomen pelvis February 14, 2021.

________________________________________________________________________________________________ 
MRI ABDOMEN AND PELVIS W/WO CONTRAST, 05/28/2021 [DATE]: 
CLINICAL INDICATION: History of colorectal malignancy with hepatic metastasis. 
Status post LEFT hepatectomy.
TECHNIQUE: Multiplanar, multiecho position MR images of the abdomen and pelvis 
were performed without and with intravenous gadolinium enhancement.  6.5 mL of 
Gadavist were injected injected intravenously by hand. Patient was scanned on a 
1.5T magnet. The patients eGFR was calculated to be 71.8 GFR mL/min/1.73 m2 
using the i-STAT device.

[Series 201: survey supine · axial · 15.0mm · 1.76mm/px · z∈[-38,+283]mm · 2 of 17 slices shown]
[im 1/17]
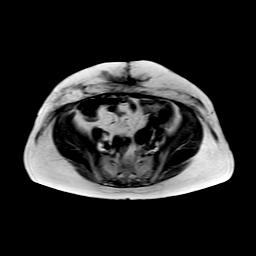
[im 17/17]
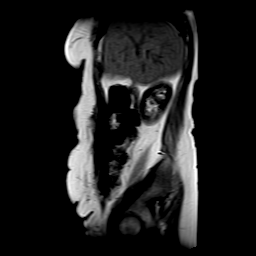

[Series 301: (id) cor bh · coronal · 4.0mm · 0.75mm/px · 2 of 40 slices shown]
[im 1/40]
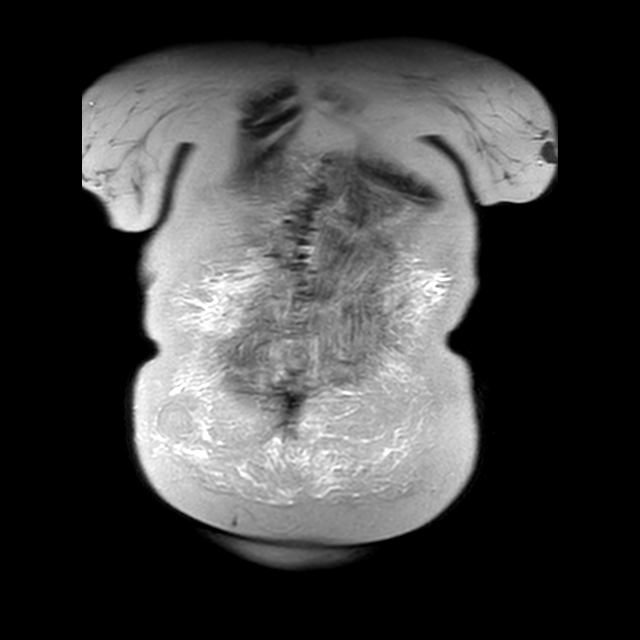
[im 40/40]
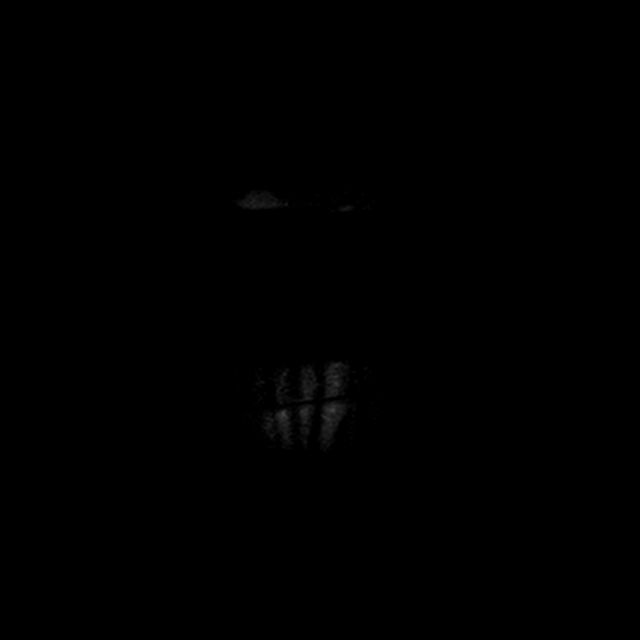

[Series 402: sout of phase · axial · 6.0mm · 1.09mm/px · z∈[-167,+318]mm · 2 of 72 slices shown]
[im 1/72]
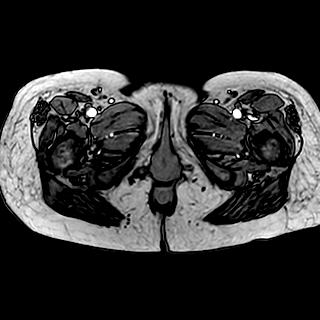
[im 72/72]
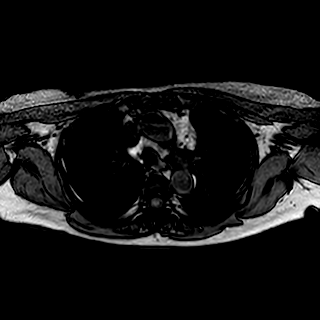

[Series 403: sin phase · axial · 6.0mm · 1.09mm/px · z∈[-167,+318]mm · 2 of 72 slices shown]
[im 1/72]
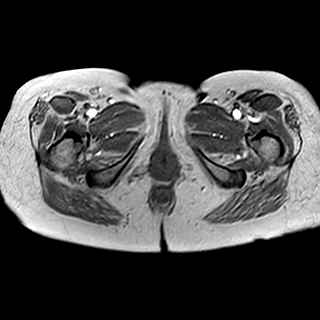
[im 72/72]
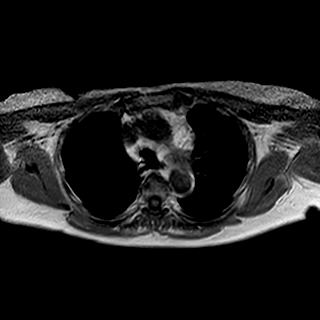

[Series 501: t2_ax_mvxd_hr_rt · axial · 5.0mm · 0.83mm/px · 1 of 84 slices shown]
[im 1/84]
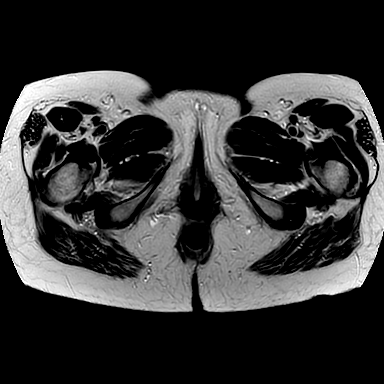

[Series 601: t2_spir_ax_mvxd_rt_fast · axial · 5.0mm · 0.88mm/px · 1 of 80 slices shown]
[im 1/80]
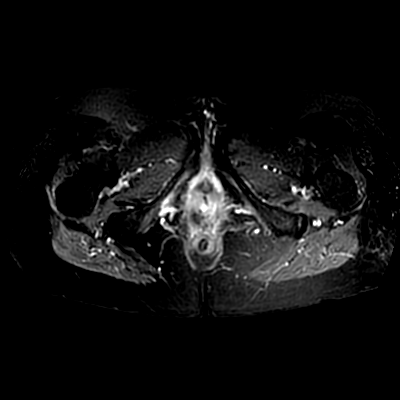

[Series 701: dwi_3b_rt · axial · 5.0mm · 1.46mm/px · z∈[+16,+285]mm · 2 of 100 slices shown]
[im 1/100]
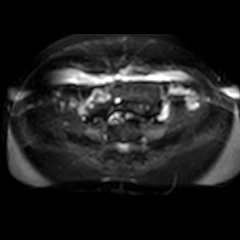
[im 100/100]
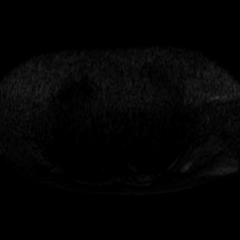

[Series 702: sbo · axial · 5.0mm · 1.46mm/px · 1 of 50 slices shown]
[im 1/50]
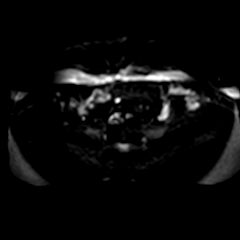

[Series 703: (id) · axial · 5.0mm · 1.46mm/px · 1 of 50 slices shown]
[im 1/50]
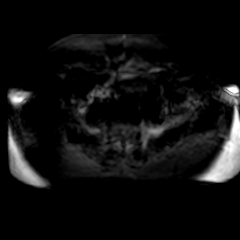

[Series 704: dadc 600 · axial · 5.0mm · 1.46mm/px · 1 of 50 slices shown]
[im 1/50]
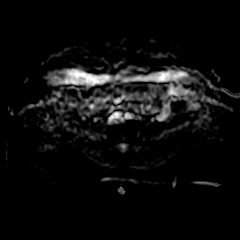

[Series 801: mdixon_dyn_(person_name)_(person_name) · axial · 3.5mm · 0.69mm/px · z∈[+20,+281]mm · 8 of 750 slices shown]
[im 1/750]
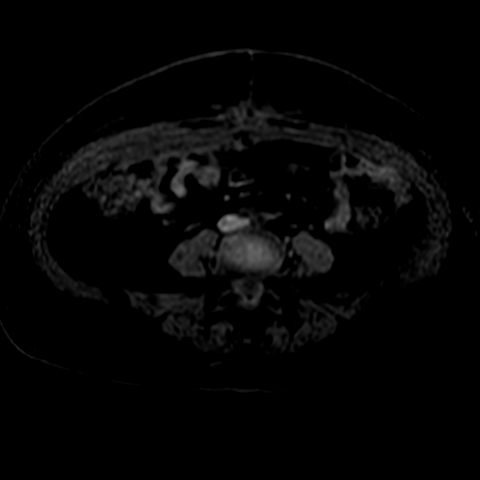
[im 137/750]
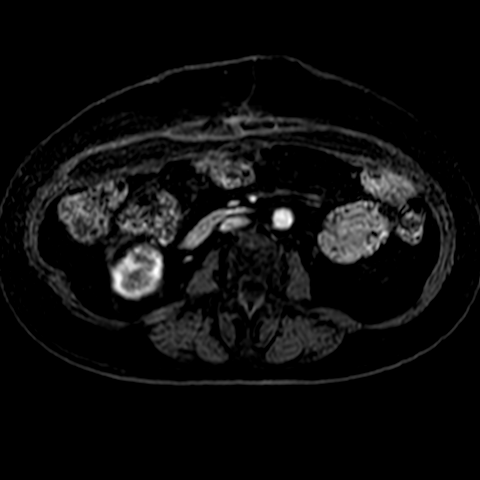
[im 205/750]
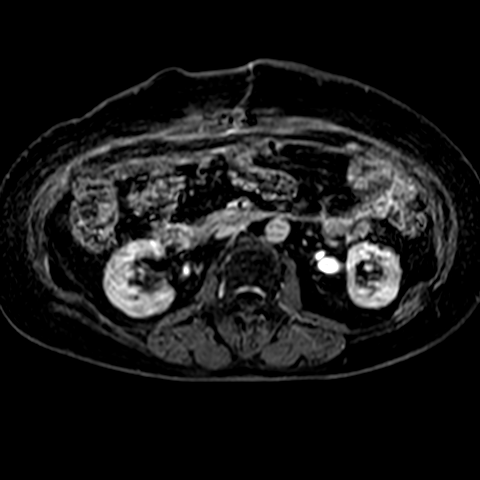
[im 341/750]
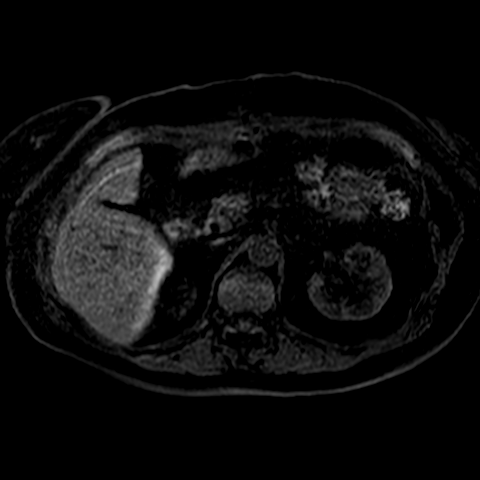
[im 409/750]
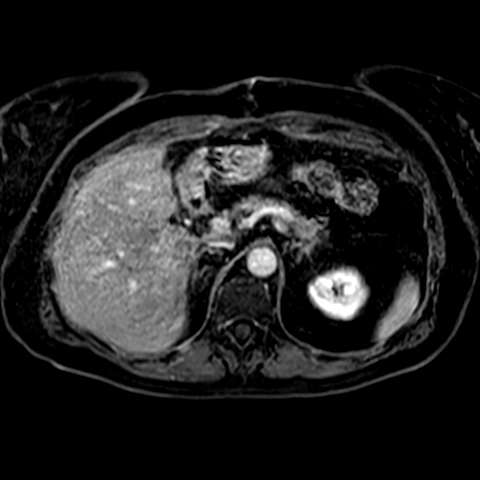
[im 545/750]
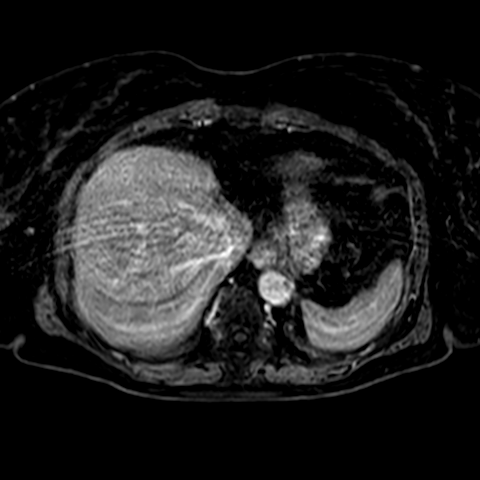
[im 613/750]
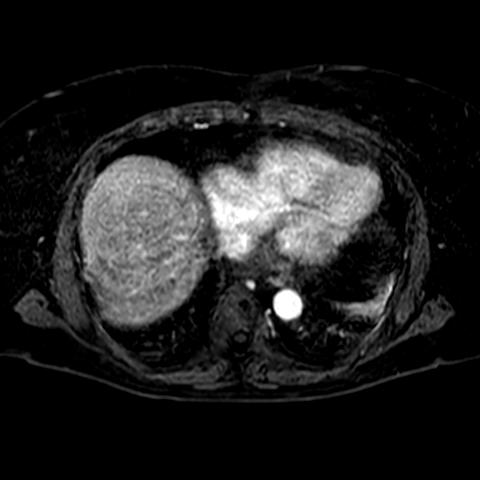
[im 750/750]
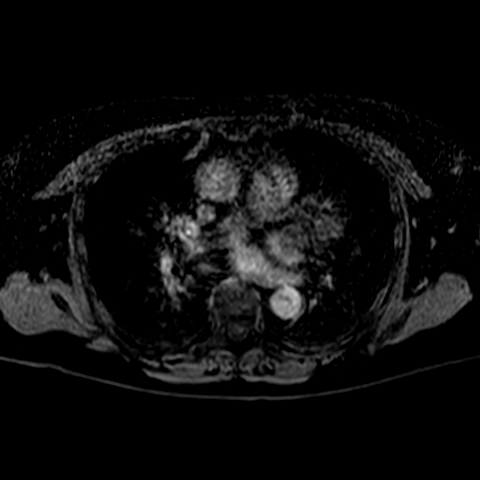

[Series 802: DIXON · axial · 3.5mm · 0.69mm/px · z∈[+20,+281]mm · 2 of 150 slices shown (1 of 2)]
[im 1/150]
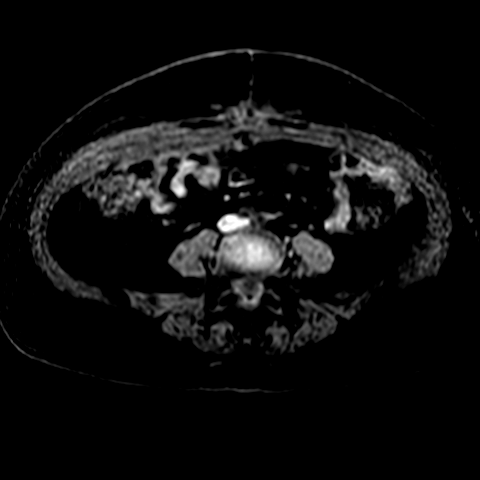
[im 150/150]
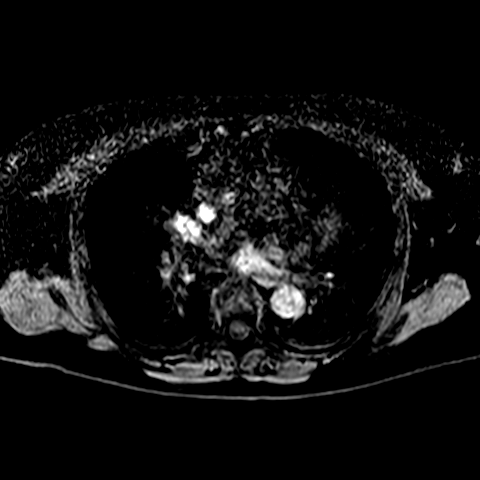

[Series 803: DIXON · axial · 3.5mm · 0.69mm/px · 1 of 150 slices shown (2 of 2)]
[im 1/150]
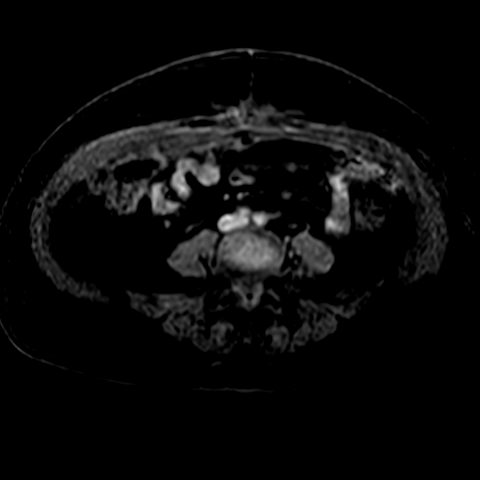

[26 of 48 positions shown; findings below may reference images not displayed]

FINDINGS: There has been LEFT hepatectomy. The RIGHT lobe of the liver remains normal in 
appearance without evidence of developing metastasis. Hepatic and portal veins 
are patent. 
The gallbladder is absent. The biliary tract is normal in caliber. There is 
focal fatty infiltration into the head of the pancreas which is stable. The 
pancreas is otherwise unremarkable. The spleen and adrenal glands are within 
normal limits. No concerning renal pathology is found. Urinary bladder is 
unremarkable. No GI tract pathology is found other than a small hiatal hernia. 
There is presacral scarring presumably related to prior treatment for colorectal 
malignancy. There is no ascites. 
Abdominal aorta is normal in caliber. Inferior vena cava is unremarkable. No 
enlarged abdominal or pelvic lymph nodes are detected. Patient has had a recent 
interval repair of a previously present large ventral hernia. There is a 
extensive fluid collection between what is presumed to be a surgical mesh and 
the anterior abdominal wall. Please correlate clinically. Small amounts of fluid 
are in the subcutaneous portion of the surgical incision. There is a persistent 
small fat-containing RIGHT lower quadrant hernia which is unchanged from the 
prior exams. 
Degenerative changes are incidentally noted in the spine.
IMPRESSION: 1. No evidence for hepatic metastasis, lymphadenopathy or other MRI evidence for 
recurrent malignancy in the abdomen. There is stable presacral scarring. Patient 
has had a LEFT hepatectomy. 
2. Patient is status post recent ventral hernia repair. There is a large fluid 
collection between the anterior abdominal wall and what is presumably a surgical 
mesh. There is a persistent small fat-containing RIGHT lower quadrant hernia.

## 2021-05-28 IMAGING — CT CT CHEST WITHOUT CONTRAST
2 of 3 series · 14 of 36 positions shown, 17 images · non-contrast
Comparison: Chest CT September 18, 2020   , CT abdomen and pelvis February 14, 2021

________________________________________________________________________________________________ 
CT CHEST WITHOUT CONTRAST, 05/28/2021 [DATE]: 
CLINICAL INDICATION:  History of colon and ileum resection, chemotherapy 3563, 
hysterectomy, oophorectomy, appendectomy, liver resection, follow-up evaluation 
A search for DICOM formatted images was conducted for prior CT imaging studies 
completed at a non-affiliated media free facility.
TECHNIQUE: The chest was scanned from base of neck through the lung bases 
without contrast on a high resolution low dose CT scanner. Routine MPR and MIP 
3D renderings were reconstructed on an independent workstation with concurrent 
physician supervision.

[Series 2: axial · axial · 0.67mm/px · z∈[-239,+19]mm · 11 of 143 slices shown, 14 images]
[im 7/143  mediastinal]
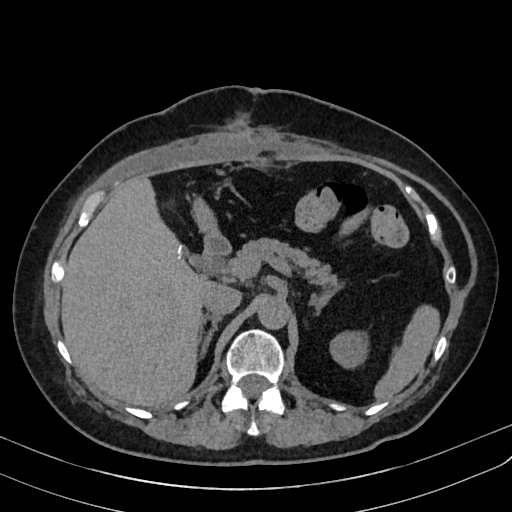
[im 7/143  lung]
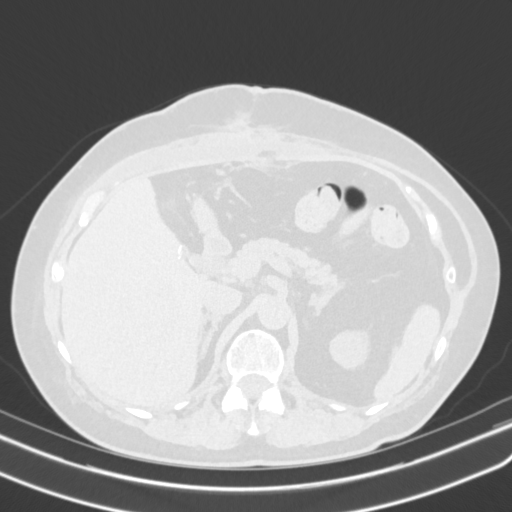
[im 21/143  lung]
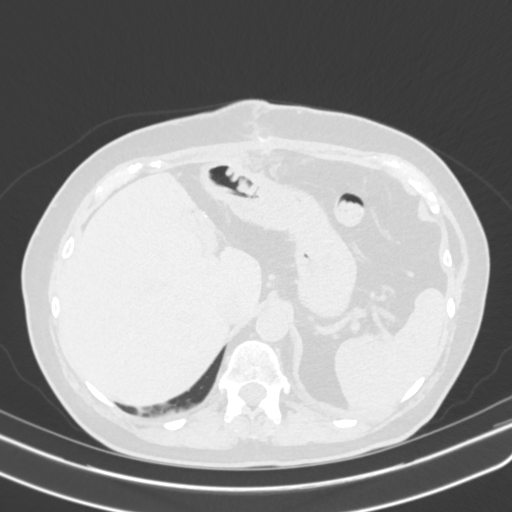
[im 34/143  lung]
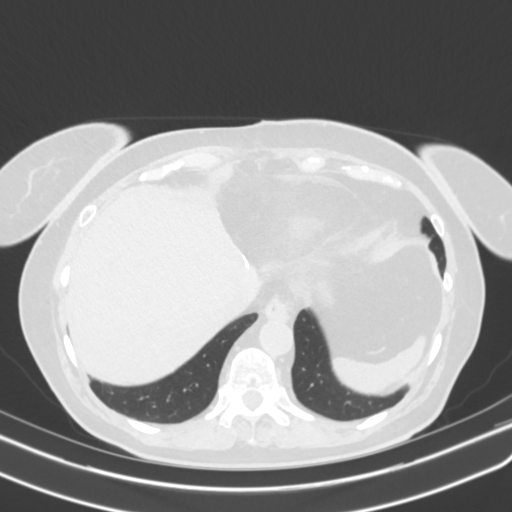
[im 48/143  lung]
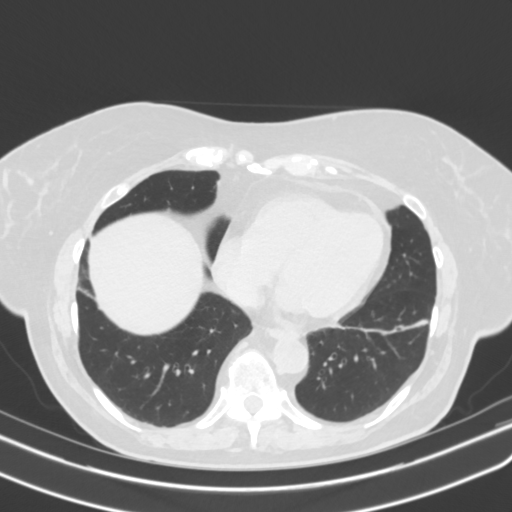
[im 61/143  mediastinal]
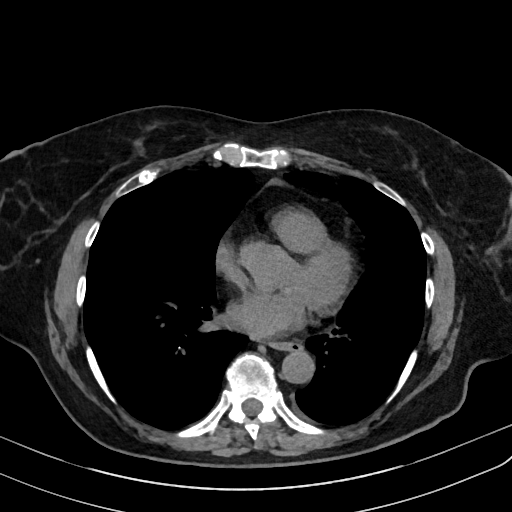
[im 61/143  lung]
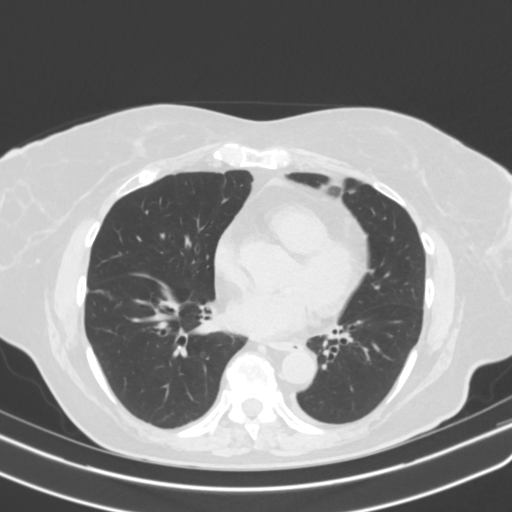
[im 69/143  lung]
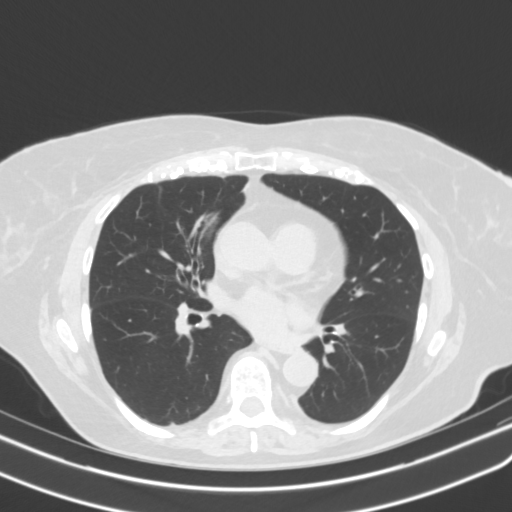
[im 82/143  lung]
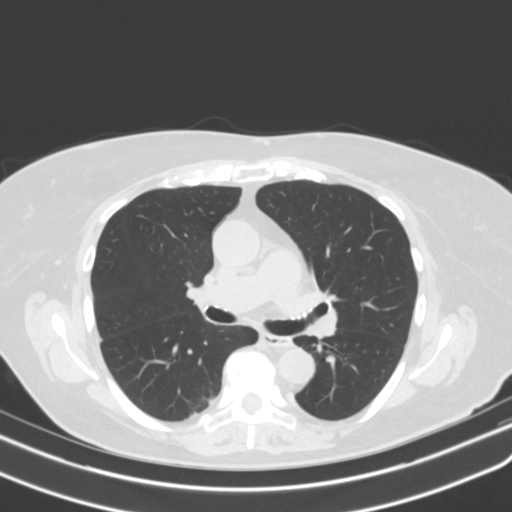
[im 95/143  lung]
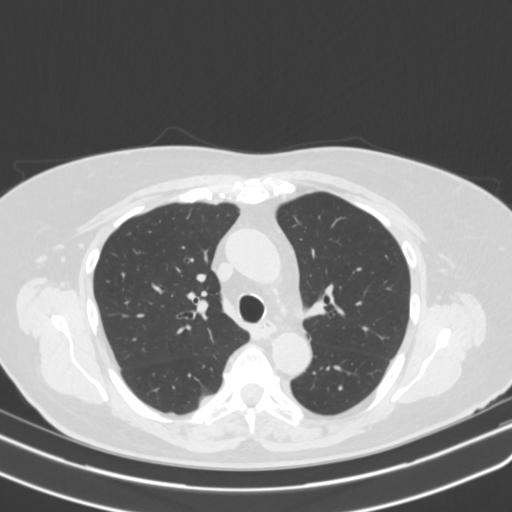
[im 109/143  mediastinal]
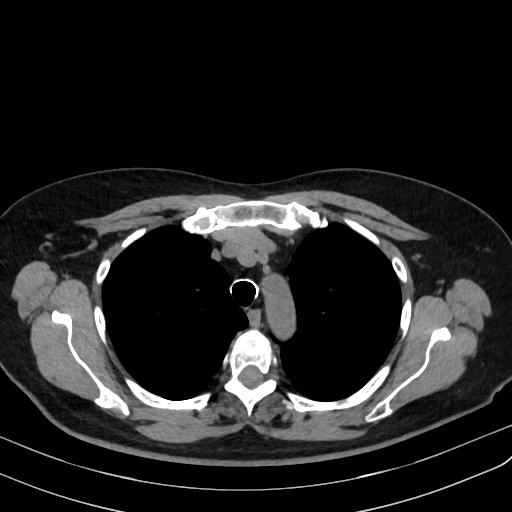
[im 109/143  lung]
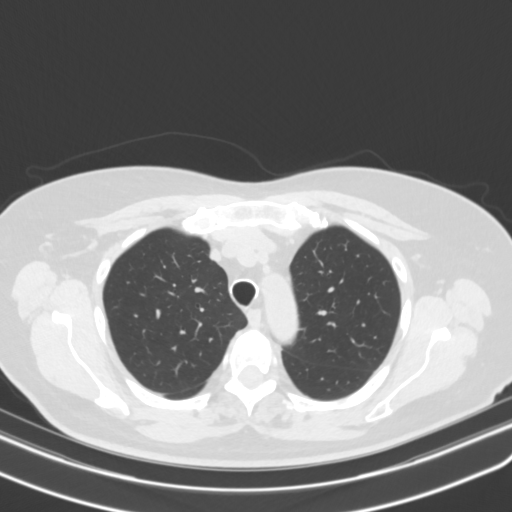
[im 122/143  lung]
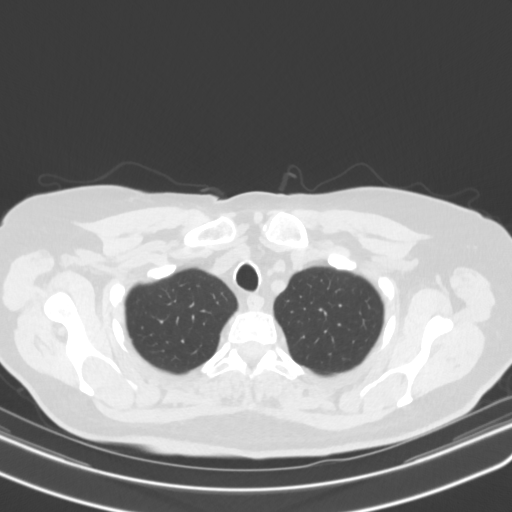
[im 136/143  lung]
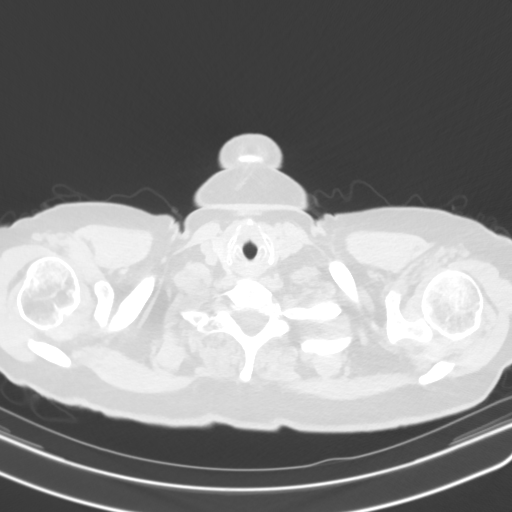

[Series 4: cor · coronal · 0.59mm/px · 3 of 116 slices shown]
[im 24/116  lung]
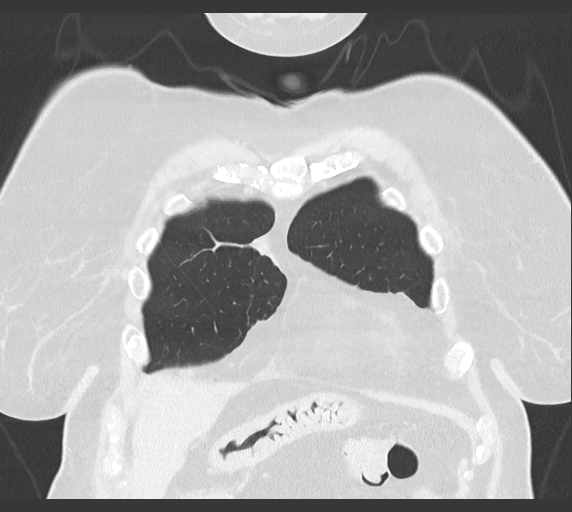
[im 47/116  lung]
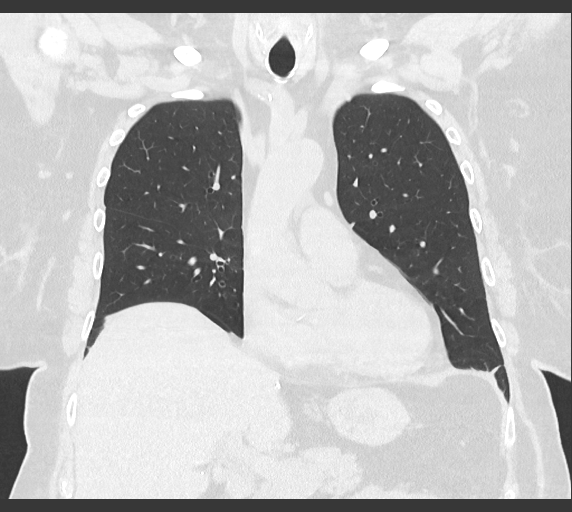
[im 70/116  lung]
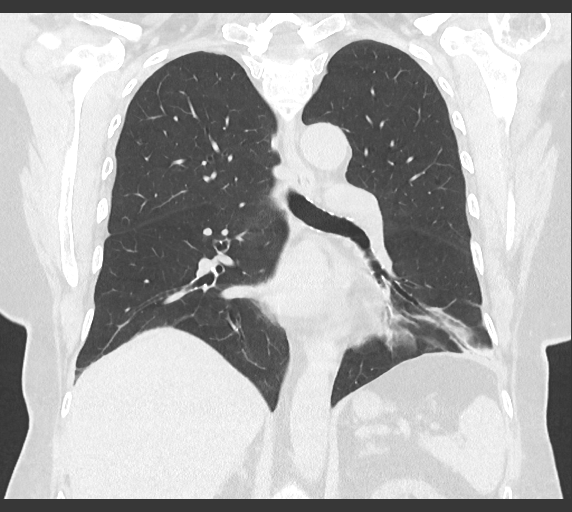

[14 of 36 positions shown; findings below may reference images not displayed]

FINDINGS: There are several small pleural-based densities along the posterior 
right lower lobe, largest extending 12 x 3 mm, axial image 57, also a 5.5 mm 
focus, image 70, and several smaller 3 to 5 mm foci. These were not present on 
the prior chest CT. There is a 2 mm left lower lobe pleural-based nodule. There 
is no pleural effusion. 2 mm right upper lobe nodule, image 36. No other 
pulmonary nodule. No infiltrate. Linear atelectasis at the lung bases, increased 
on the left compared to the prior study. Minor lingular atelectasis is stable. 
 Thyroid lobes are not enlarged. There is no mediastinal or axillary adenopathy. 
Heart is not enlarged. There is minimal pericardial fluid. Main pulmonary artery 
diameter is 2.4 cm, within normal limits. 
Small hiatal hernia. No adrenal nodule. Surgical clips at the porta hepatis and 
along the anteroinferior hepatic margin. Spleen is not enlarged. Thoracic aorta 
shows normal caliber. There are moderate thoracic spine degenerative changes. 
Midline laparotomy changes. There is a fluid collection along the upper anterior 
abdominal wall, not present on the February 14, 2021 abdominal CT.
IMPRESSION: New pleural-based nodules along the posterior right lower lobe. These are likely 
inflammatory sequelae but indeterminant. Short-term follow-up chest CT in 3-6 
months would be useful. If clinically warranted, PET/CT can be obtained. There 
is a 2 mm right upper lobe nodule which is stable. 
No pleural effusion or lymphadenopathy. Linear atelectasis at the left base as 
mildly worse. 
Small hiatal hernia. 
There is a midline laparotomy incision along the upper abdomen, and there is 
fluid extending along the anterior abdominal wall, external to the peritoneal 
cavity, not present on the January 2021 abdominal CT. Fluid collection extends 14 
cm in coronal plane, 1.0 cm in depth. This is not completely evaluated. Has 
there been recent laparotomy? Clinical correlation necessary. 
Status post cholecystectomy and partial hepatic resection. 
RADIATION DOSE REDUCTION: All CT scans are performed using radiation dose 
reduction techniques, when applicable.  Technical factors are evaluated and 
adjusted to ensure appropriate moderation of exposure.  Automated dose 
management technology is applied to adjust the radiation doses to minimize 
exposure while achieving diagnostic quality images.

## 2021-06-07 IMAGING — PT PET CT SCAN TUMOR IMAGING SKULL TO THIGH  (FC
1 of 3 series · 1 of 25 positions shown · non-contrast
Comparison: CT scan 05/20/2021

________________________________________________________________________________________________ 
PET CT SCAN TUMOR IMAGING SKULL TO THIGH  (FC, 06/07/2021 [DATE]: 
(Films were taken at [facility] on 06/07/2021 and interpreted at 
[facility] on 06/07/2021. 
CLINICAL INDICATION:  History of metastatic colon carcinoma. Recent abnormal CT 
scan.
TECHNIQUE: A dose of 14.6 millicuries of 18-FDG was administered intravenously 
and skull to thigh PET scanning was performed at 81 minutes. Tomographic scans 
were reconstructed in axial, coronal, and sagittal projections. The data was 
reconstructed into a three-dimensional volume rendered image and reviewed in a 
rotational cine loop. Serum blood glucose at the time of injection was 111 
mg/dl.

[Series 4: ct wb · axial · 4.0mm · 0.98mm/px · 1 of 198 slices shown]
[im 198/198  brain]
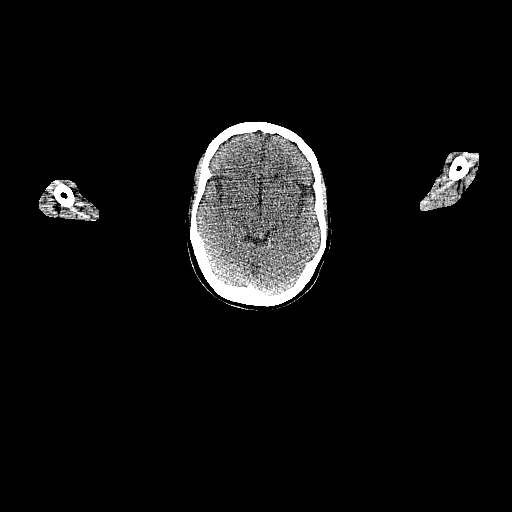

[1 of 25 positions shown; findings below may reference images not displayed]

FINDINGS: NECK/CHEST: The pleural-based nodules described on the previous examination are 
no longer present. Mild atelectatic changes. No abnormal FDG activity seen 
within the neck or chest. 
ABDOMEN/PELVIS: There is activity seen overlying the anterior abdominal wall in 
this patient who has had repair of the anterior abdominal wall hernia since 
January 2021 exam. Persistent seroma. Maximum SUV of 4.5 which is very 
nonspecific and not a  neoplastic appearance. 
MUSCULOSKELETAL: No abnormal FDG uptake. 
ADDITIONAL CT FUSION FINDINGS: Atherosclerotic changes without significant 
coronary calcifications. Postop changes in the abdomen involving the liver and 
bowel. Anterior abdominal wall surgery. There is a right lower quadrant hernia 
containing fat. No new abnormality seen.
IMPRESSION: No residual pleural nodules seen. No findings suspicious for malignancy. 
Postoperative changes, mild atherosclerotic changes and degenerative changes.

## 2021-06-15 IMAGING — MG MAMMOGRAPHY SCREENING BILATERAL 3[PERSON_NAME]
8 series · 8 of 24 positions shown · non-contrast
Comparison: Comparison was made to prior examinations.

________________________________________________________________________________________________ 
MAMMOGRAPHY SCREENING BILATERAL 3CHEKO MARTINEZ GARCIA, 06/15/2021 [DATE]: 
CLINICAL INDICATION: Screening. History of negative RIGHT breast biopsy.
TECHNIQUE: Digital bilateral mammograms and 3-D Tomosynthesis were obtained. 
These were interpreted both primarily and with the aid of computer-aided 
detection system.  
BREAST DENSITY: (Level B) There are scattered areas of fibroglandular density.

[L MLO]
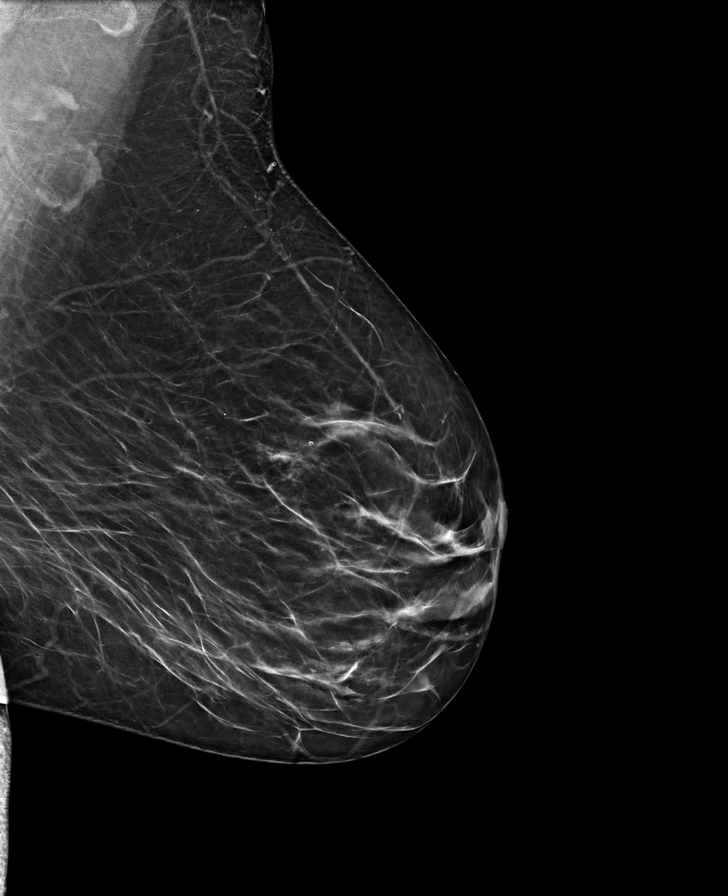

[R CC]
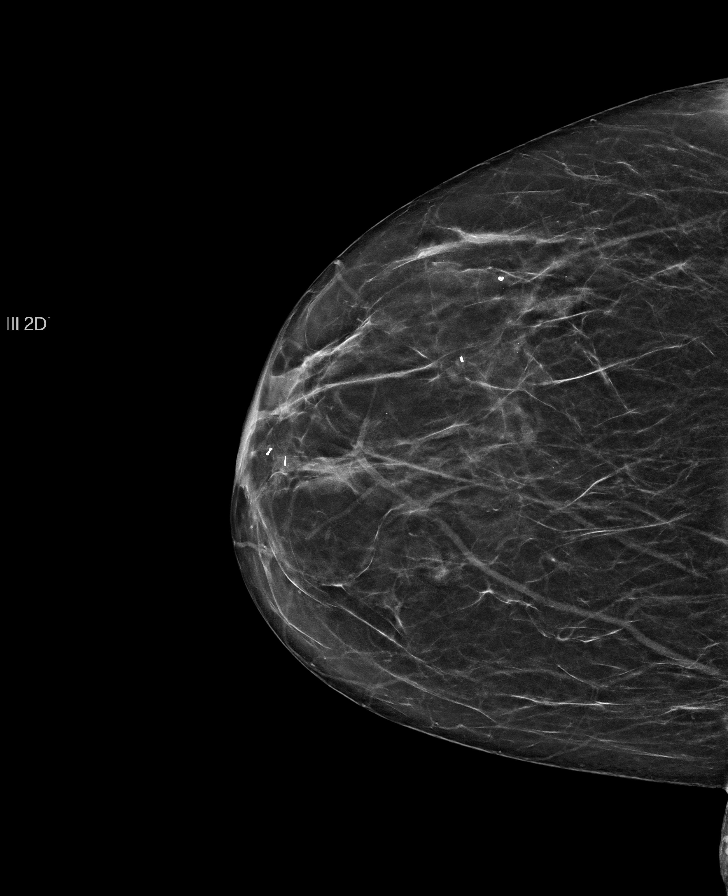

[R MLO]
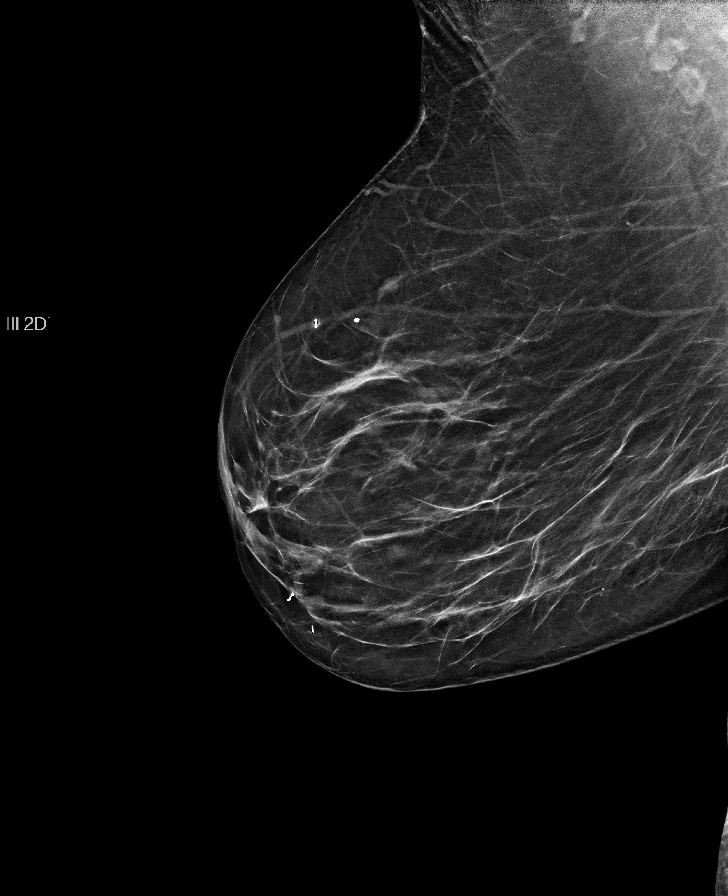

[L CC]
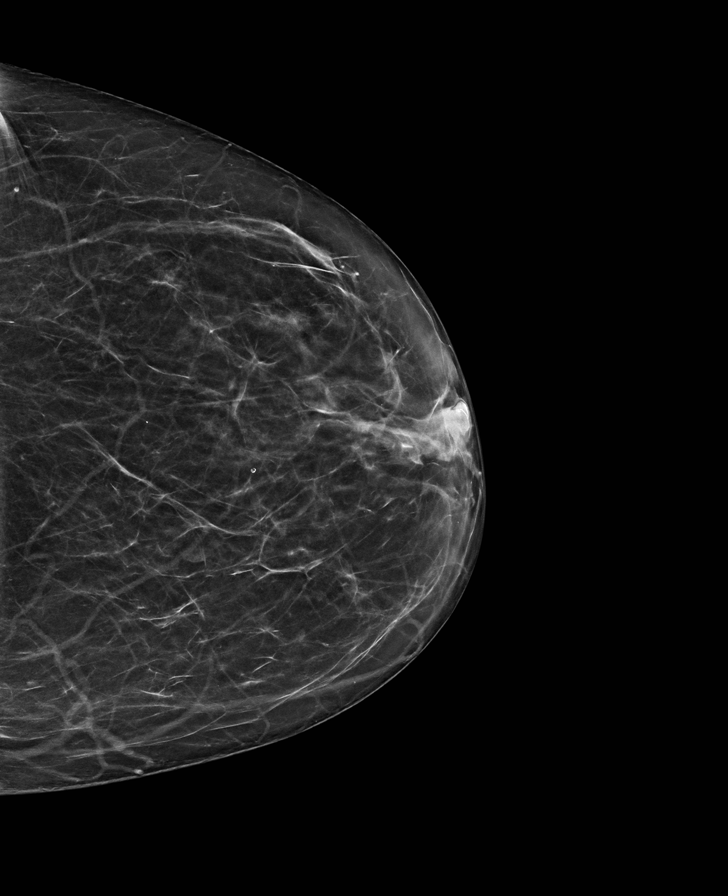

[R MLO tomo · tomo slice 35/69.0]
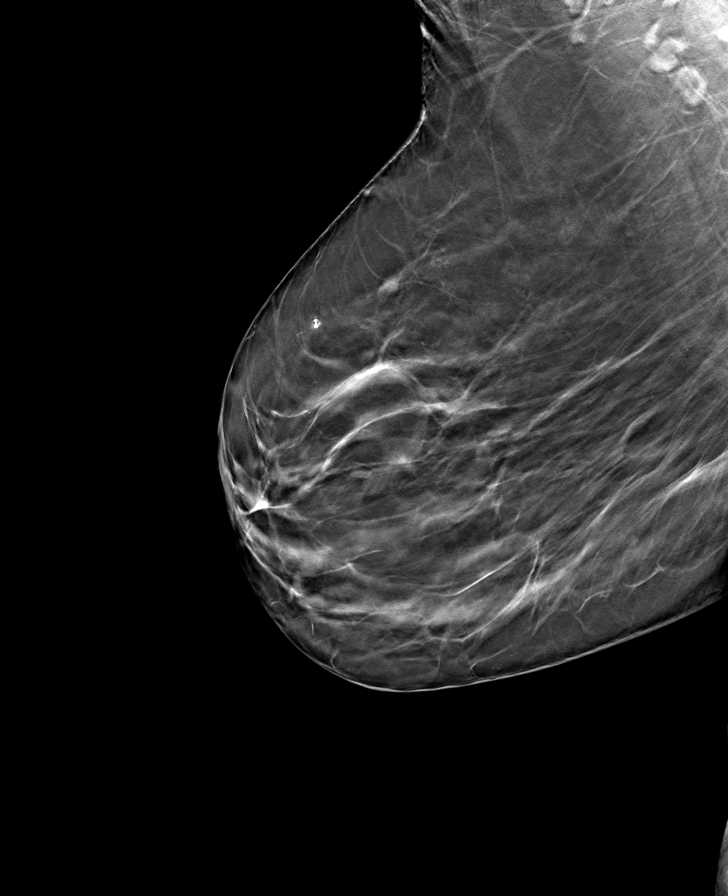

[L CC tomo · tomo slice 29/57.0]
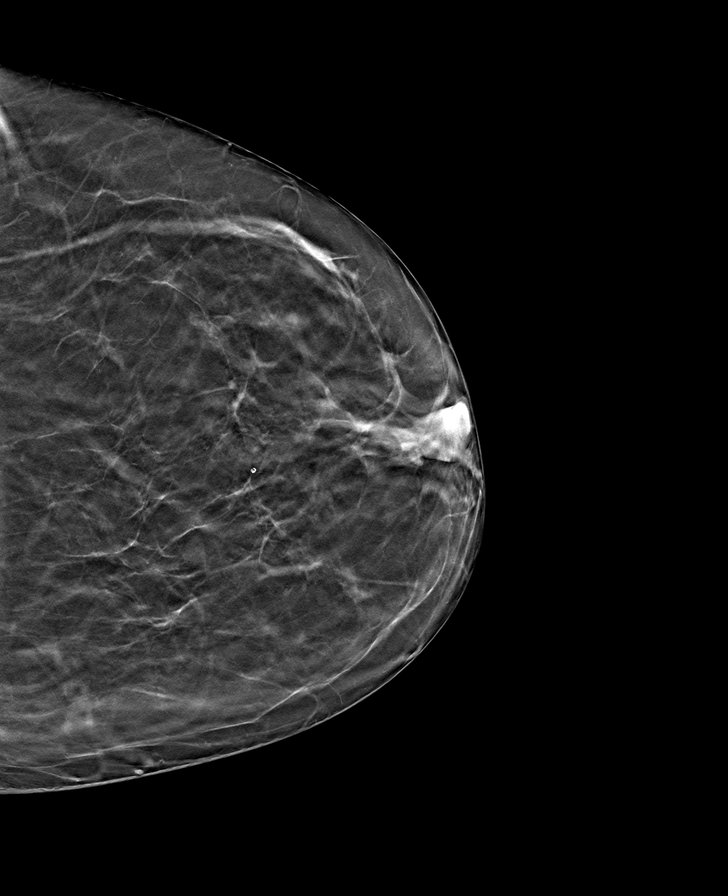

[R CC tomo · tomo slice 27/54.0]
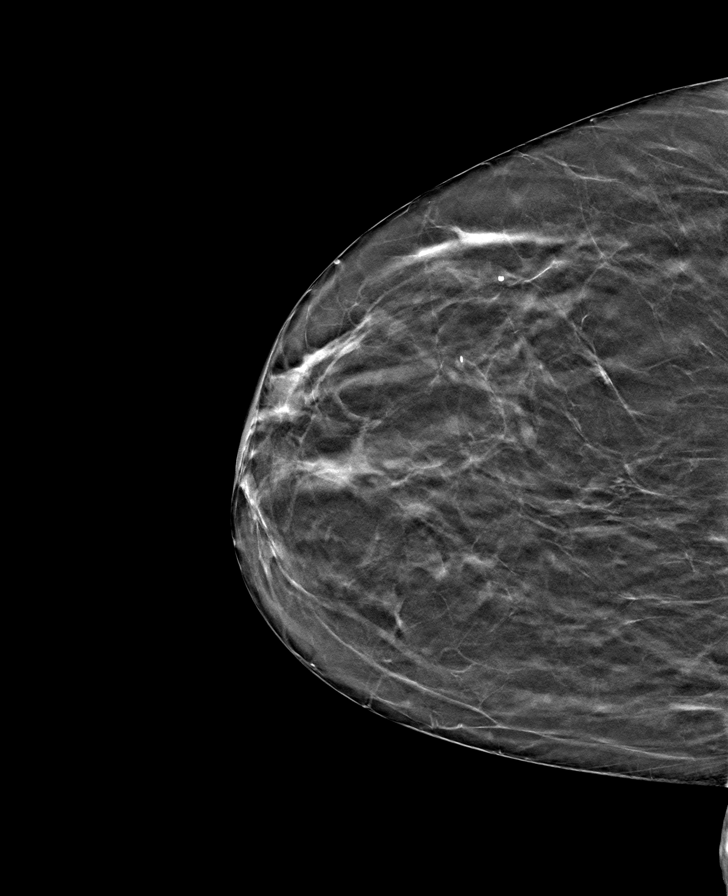

[L MLO tomo · tomo slice 35/68.0]
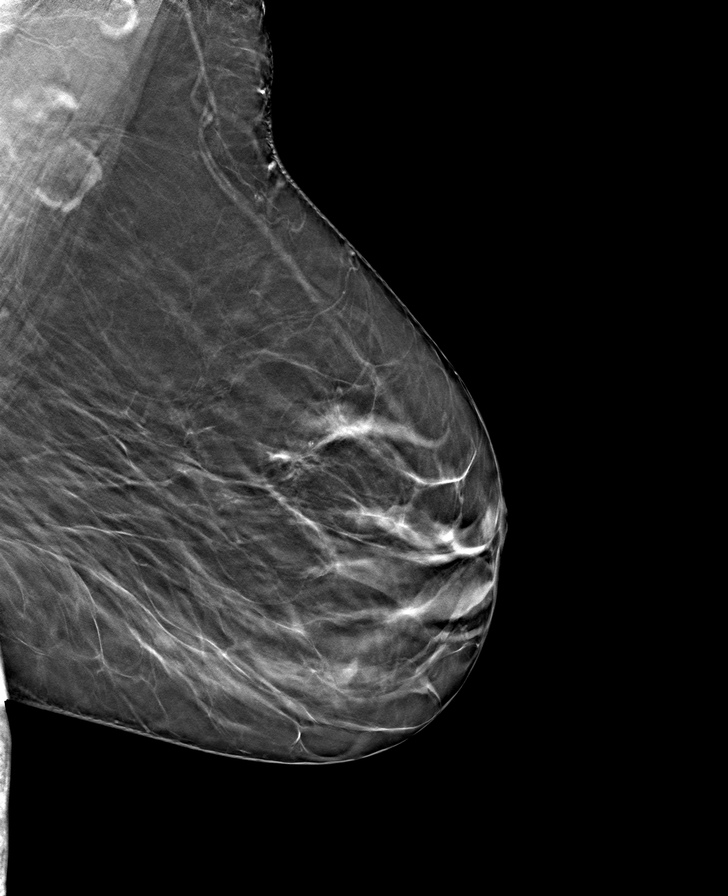

[8 of 24 positions shown; findings below may reference images not displayed]

FINDINGS: There are biopsy markers in the RIGHT breast. No mammographically suspicious 
abnormality and no significant change from prior mammograms.
IMPRESSION: (BI-RADS 1) Negative mammogram. Routine mammographic follow-up is recommended.

## 2021-11-15 IMAGING — CT CT CHEST/ABDOMEN AND PELVIS WITH CONTRAST
1 of 3 series · 12 of 32 positions shown, 17 images · IV contrast (isovue)
Comparison: CT from May 28, 2021 of the chest. CT from July 10, 2021 of 
the abdomen and pelvis.

________________________________________________________________________________________________ 
******** ADDENDUM #1 ********/n 
CORRECTED CONTRAST IN TECHNIQUE:  The region of interest was scanned with 80 mL 
of Isovue 370 IV contrast on a high resolution CT scanner.  Routine MPR 
reconstructions were performed. 
CT CHEST/ABDOMEN AND PELVIS WITH CONTRAST, 11/15/2021 [DATE]: 
(Films were taken at [facility].)  
CLINICAL INDICATION:  Rectal cancer. Status post resection of hepatic metastatic 
lesion. 
A search for DICOM formatted images was conducted for prior CT imaging studies 
completed at a non-affiliated media free facility.
TECHNIQUE: The region of interest was scanned with 100 mL IV contrast on a high 
resolution CT scanner.  Routine MPR reconstructions were performed.

[Series 2: cap w · axial · 0.79mm/px · z∈[-722,-173]mm · 12 of 209 slices shown, 17 images]
[im 13/209  soft-tissue]
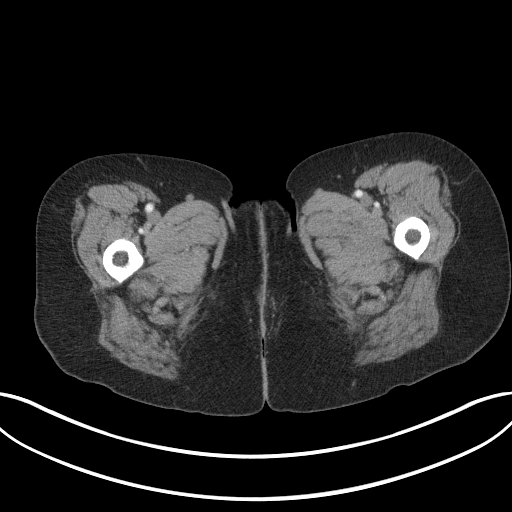
[im 13/209  bone]
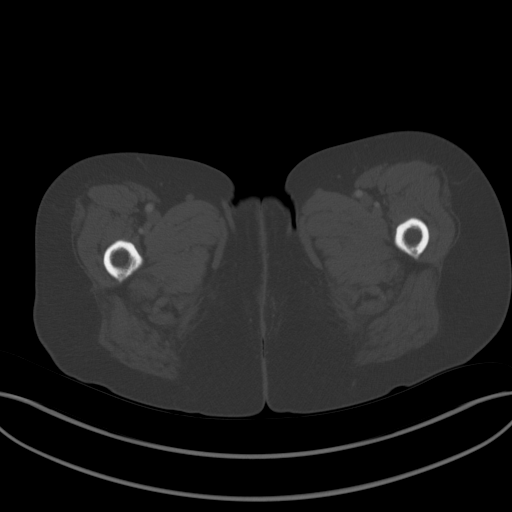
[im 37/209  soft-tissue]
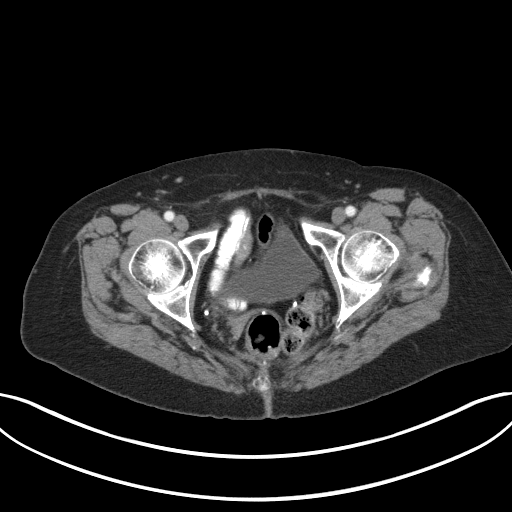
[im 49/209  soft-tissue]
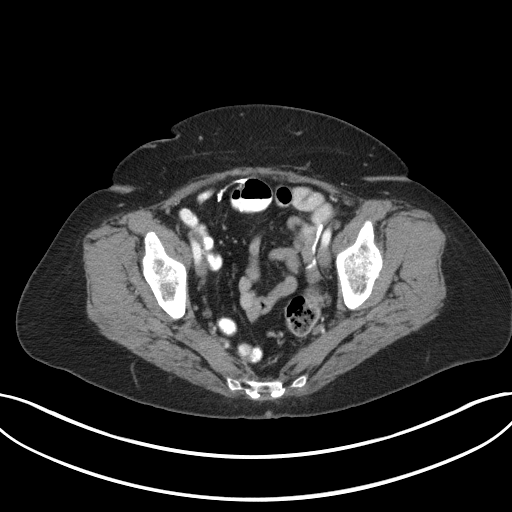
[im 74/209  soft-tissue]
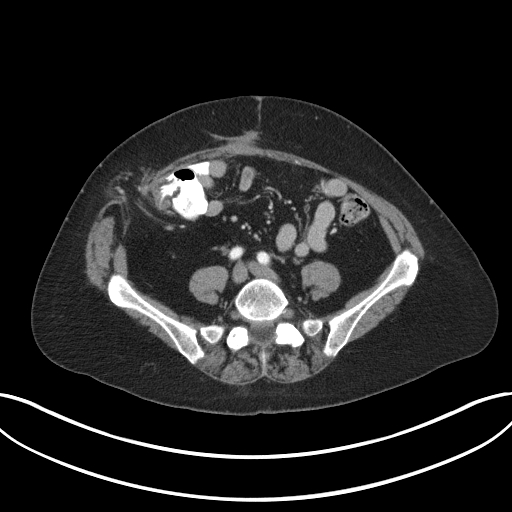
[im 86/209  soft-tissue]
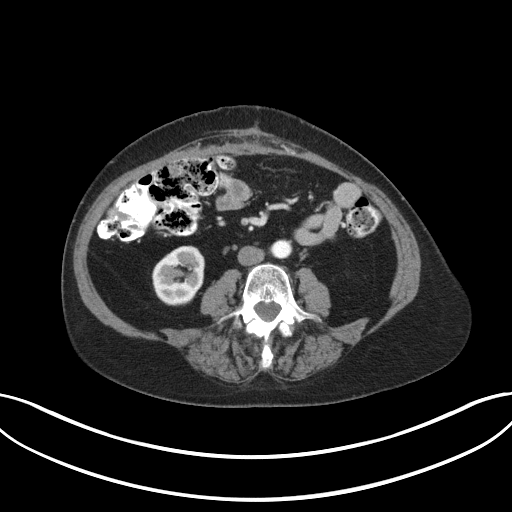
[im 111/209  soft-tissue]
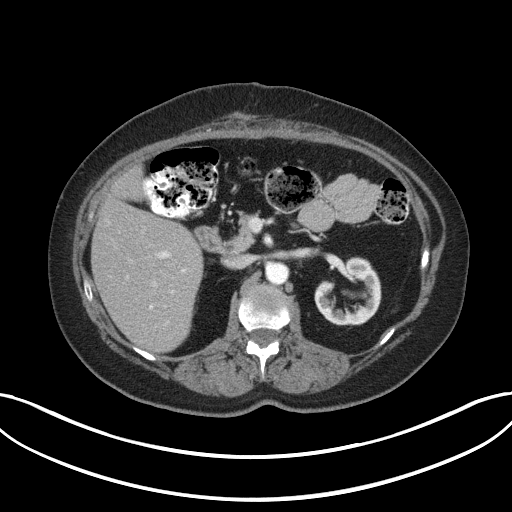
[im 123/209  soft-tissue]
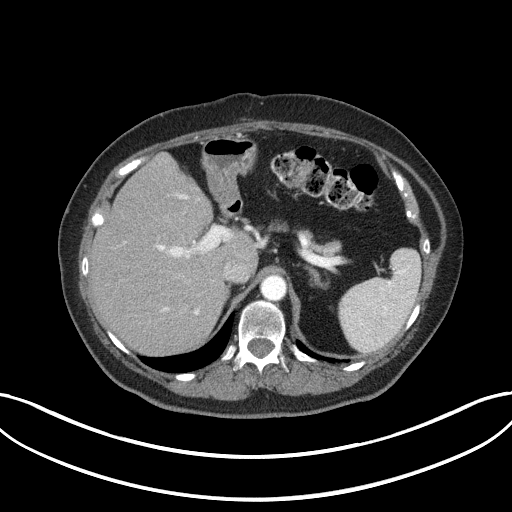
[im 135/209  soft-tissue]
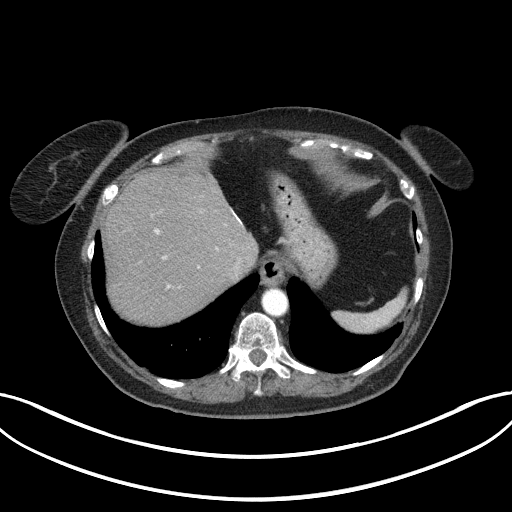
[im 160/209  soft-tissue]
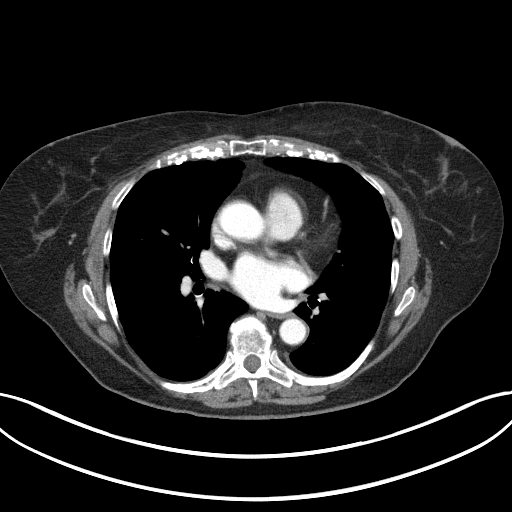
[im 160/209  lung]
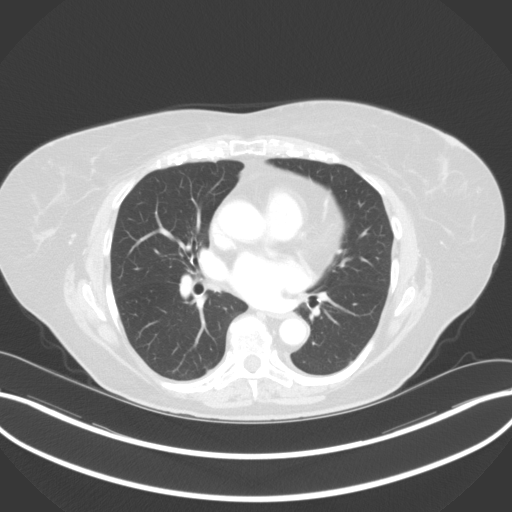
[im 160/209  bone]
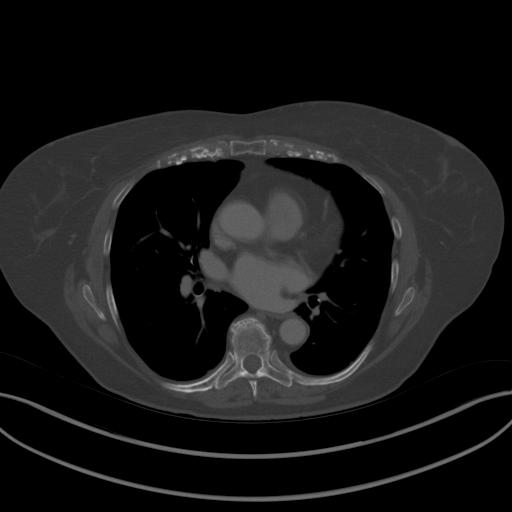
[im 172/209  soft-tissue]
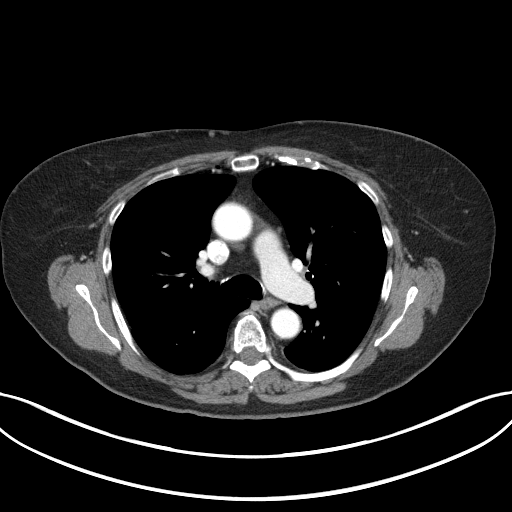
[im 172/209  lung]
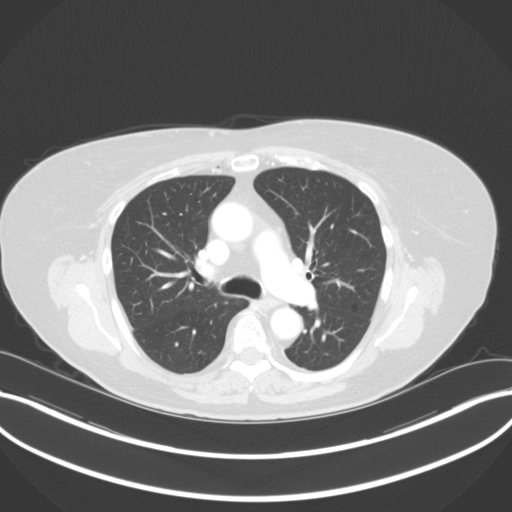
[im 184/209  lung]
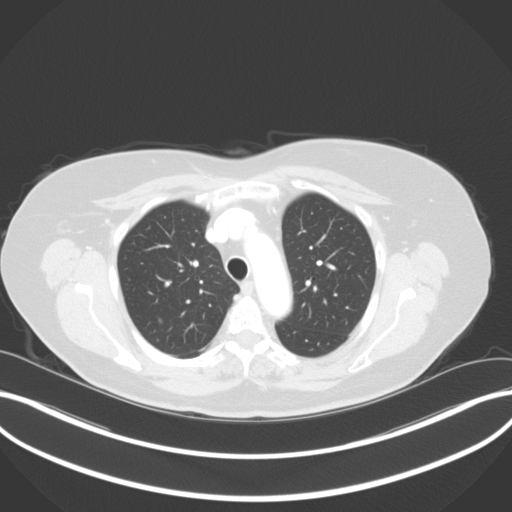
[im 196/209  soft-tissue]
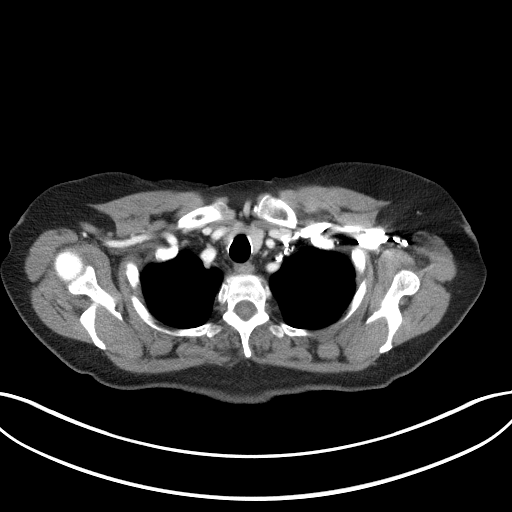
[im 196/209  lung]
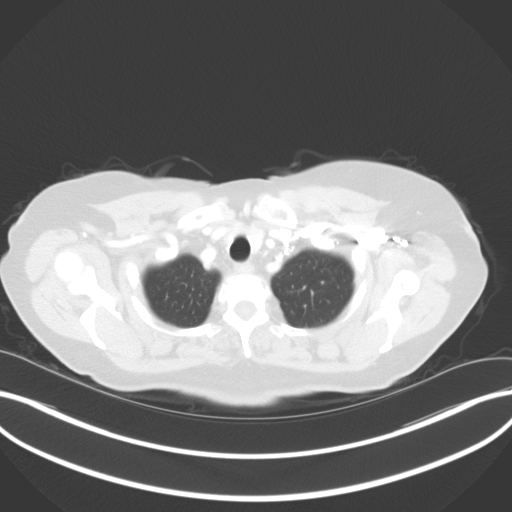

[12 of 32 positions shown; findings below may reference images not displayed]

FINDINGS: --------------------------------------------------------------------------- 
Chest: 
LUNGS AND PLEURA:   Scarring in the right upper lobe anteriorly. Scarring in the 
right lower lobe laterally. Left lower lobe scarring. There are scattered areas 
of pleural thickening noted along the posterior margins of the lung bases. The 
largest area previously present measuring 1.2 x 0.3 cm has resolved. The other 
areas are stable and smaller. 
MEDIASTINUM:  No pathologic lymphadenopathy.   
CARDIOVASCULAR:  Heart size is normal.  No aneurysm.   
LOWER NECK:  No focal mass. 
CHEST WALL/AXILLA:  No mass or adenopathy.  
OSSEOUS STRUCTURES:  No acute osseous abnormality. Scattered degenerative 
changes.  
--------------------------------------------------------------------------- 
Abdomen Pelvis: 
HEPATOBILIARY:  Status post resection of the left lobe of the liver.  Status 
post cholecystectomy. 
PANCREAS:  No ductal dilatation or mass.   
SPLEEN: Normal in size. 
ADRENALS:  No mass. 
GENITOURINARY:  Stable left lower pole nonobstructing stone measuring up to 7 
mm.  No bladder mass. 
STOMACH, SMALL BOWEL AND COLON:  No bowel wall thickening or obstruction. 
Surgical suture at the colorectal junction. 
ABDOMINAL/PELVIC LYMPH NODES:  No adenopathy. 
VASCULAR STRUCTURES:  No aneurysm.   
PERITONEUM:  No free fluid or free air. Scarring in the presacral space. 
Surgical suture along the pelvic sidewalls. 
ABDOMINAL/PELVIC WALL:  No mass or adenopathy. Ventral abdominal wall scarring. 
Right lower quadrant fat containing hernia along the low anterior abdominal 
wall, stable. 
OSSEOUS STRUCTURES:  No acute osseous abnormality. Scattered degenerative 
changes.  
ADDITIONAL FINDINGS:  Status post hysterectomy. 
---------------------------------------------------------------------------
IMPRESSION: No metastatic disease in the chest, abdomen, pelvis. Continued follow-up imaging 
with CT in 6 months is recommended. 
RADIATION DOSE REDUCTION: All CT scans are performed using radiation dose 
reduction techniques, when applicable.  Technical factors are evaluated and 
adjusted to ensure appropriate moderation of exposure.  Automated dose 
management technology is applied to adjust the radiation doses to minimize 
exposure while achieving diagnostic quality images.

## 2021-12-26 IMAGING — MR MRI ABDOMEN AND PELVIS W/WO CONTRAST
25 of 33 series · 32 of 48 positions shown · IV contrast (gadolinium)
Comparison: CT 11/15/2021, PET scan 06/07/2021, MR abdomen 05/28/2021.

________________________________________________________________________________________________ 
MRI ABDOMEN AND PELVIS W/WO CONTRAST, 12/26/2021 [DATE]: 
CLINICAL INDICATION: Hx of metastatic recurrence of rectal cancer,liver mets.
TECHNIQUE: Multiplanar, multiecho position MR images of the abdomen were 
performed without and with intravenous gadolinium enhancement.  6 mL of Gadavist 
were injected intravenously by hand. 1.5 mL of Gadavist were discarded. Patient 
was scanned on a 3T magnet.

[Series 101: survey-bh · axial · 15.0mm · 1.69mm/px · 1 of 10 slices shown (1 of 3)]
[im 1/10]
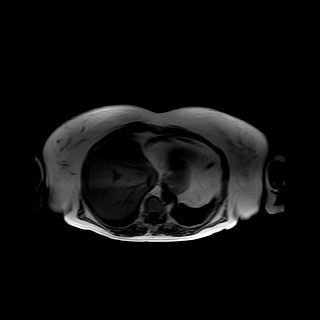

[Series 201: survey-bh · axial · 15.0mm · 1.56mm/px · 1 of 10 slices shown (2 of 3)]
[im 1/10]
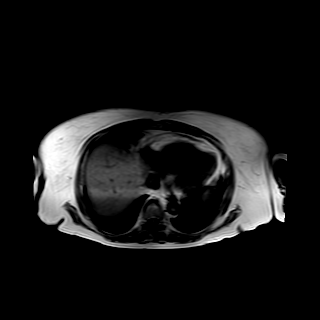

[Series 302: smdixon-in phase · axial · 3.5mm · 0.86mm/px · 1 of 135 slices shown (1 of 2)]
[im 1/135]
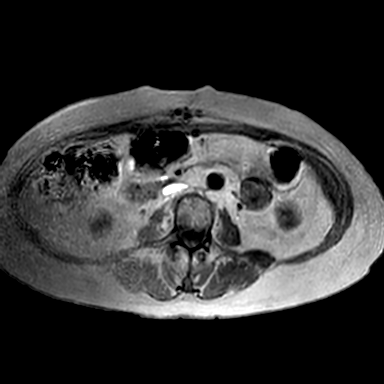

[Series 303: smdixon-out of phase · axial · 3.5mm · 0.86mm/px · 1 of 135 slices shown]
[im 1/135]
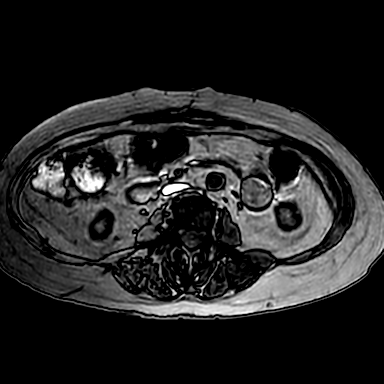

[Series 402: smdixon-in phase · axial · 3.5mm · 0.86mm/px · 1 of 135 slices shown (2 of 2)]
[im 1/135]
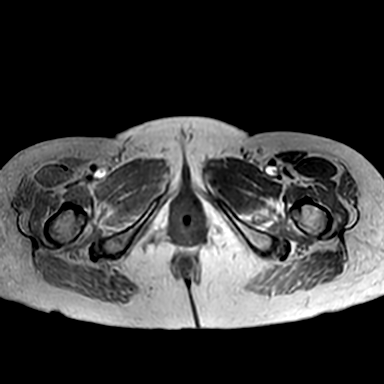

[Series 501: (id) (abd) · coronal · 5.0mm · 0.75mm/px · 1 of 36 slices shown]
[im 1/36]
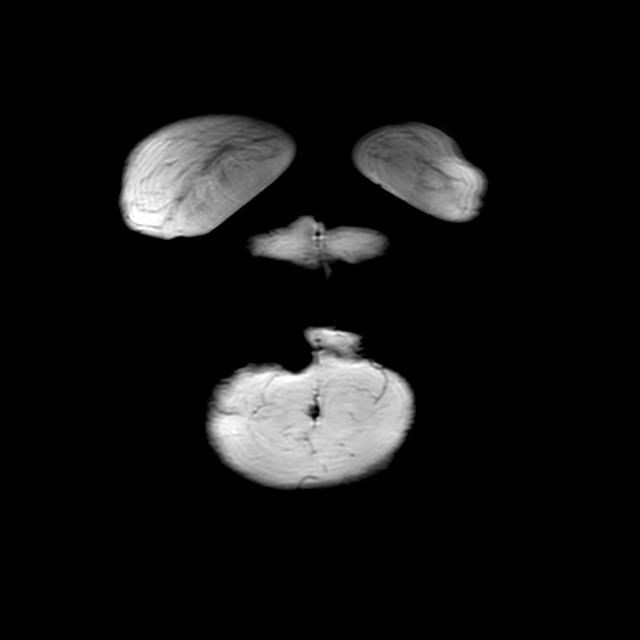

[Series 601: t2w_ax-rt-hi · axial · 6.0mm · 0.64mm/px · 1 of 32 slices shown]
[im 1/32]
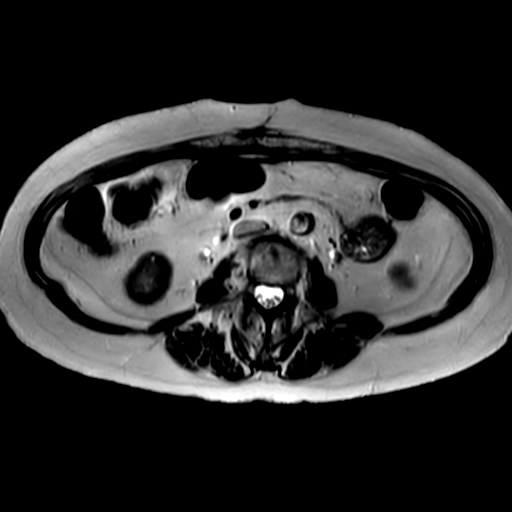

[Series 701: t2w_ax-low · axial · 6.0mm · 0.69mm/px · 1 of 36 slices shown]
[im 1/36]
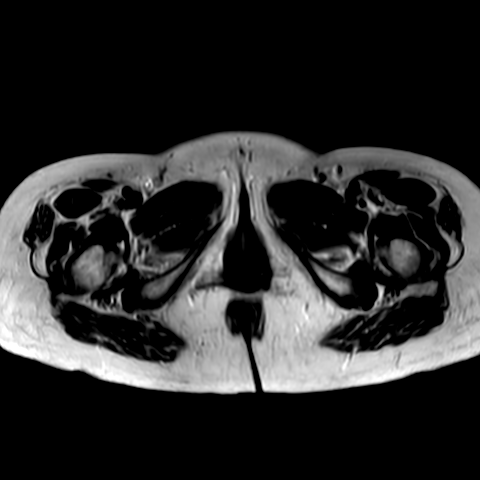

[Series 801: t2w_spair-hi · axial · 6.0mm · 0.86mm/px · 1 of 32 slices shown]
[im 1/32]
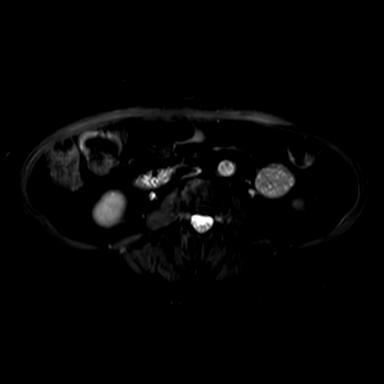

[Series 901: t2w_spair-low · axial · 6.0mm · 0.86mm/px · 1 of 36 slices shown]
[im 1/36]
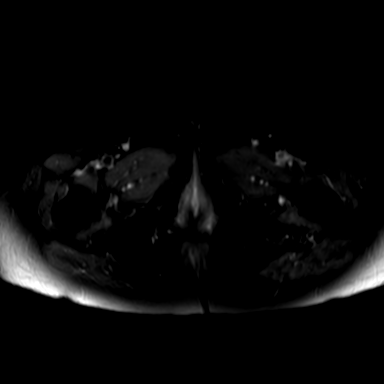

[Series 1001: t1w_cor(pelvis) · coronal · 5.0mm · 0.66mm/px · 1 of 36 slices shown]
[im 1/36]
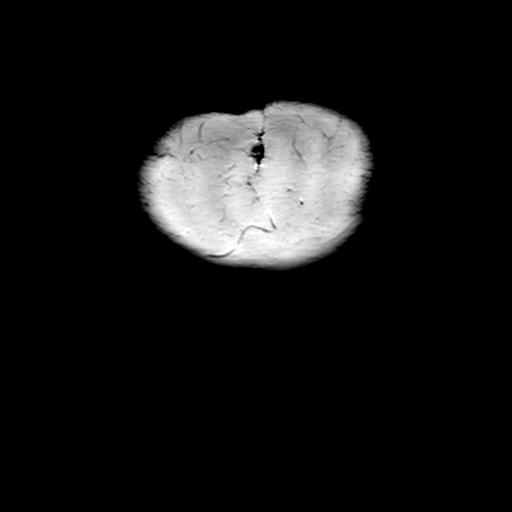

[Series 1102: dadc 600 · axial · 5.0mm · 1.56mm/px · 1 of 45 slices shown]
[im 1/45]
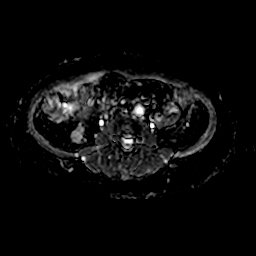

[Series 1103: sb0 · axial · 5.0mm · 1.56mm/px · 1 of 45 slices shown]
[im 1/45]
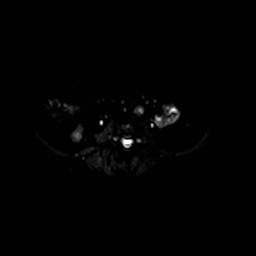

[Series 1104: (id) · axial · 5.0mm · 1.56mm/px · 1 of 45 slices shown]
[im 1/45]
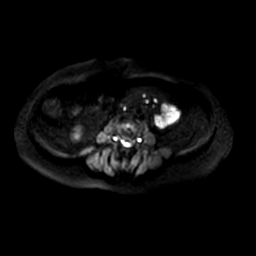

[Series 1201: survey-bh · sagittal · 15.0mm · 1.56mm/px · 1 of 4 slices shown (3 of 3)]
[im 1/4]
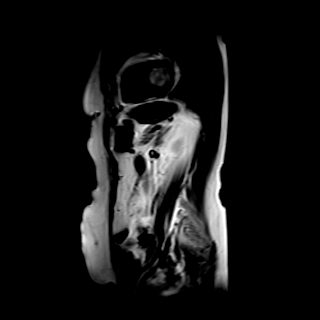

[Series 1302: DIXON · axial · 3.5mm · 0.86mm/px · 1 of 125 slices shown (1 of 5)]
[im 1/125]
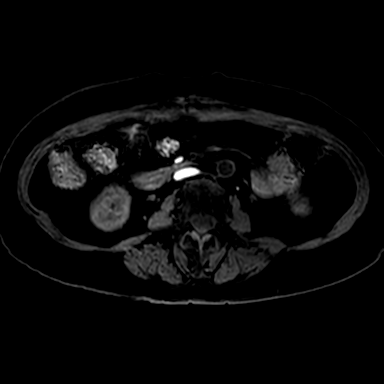

[Series 1303: DIXON · axial · 3.5mm · 0.86mm/px · 1 of 125 slices shown (2 of 5)]
[im 1/125]
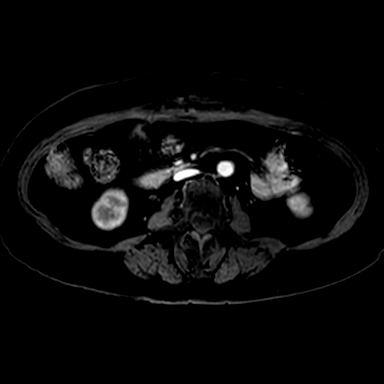

[Series 1304: DIXON · axial · 3.5mm · 0.86mm/px · z∈[+44,+261]mm · 2 of 125 slices shown (3 of 5)]
[im 1/125]
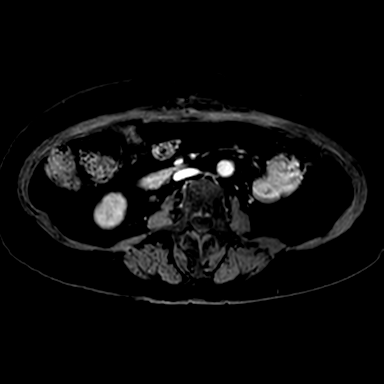
[im 125/125]
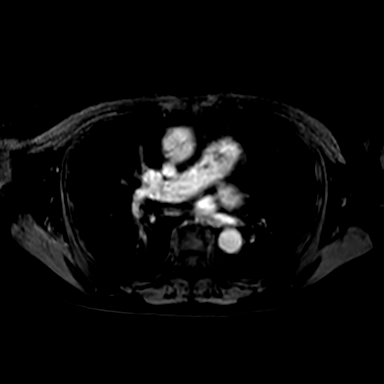

[Series 1305: DIXON · axial · 3.5mm · 0.86mm/px · z∈[+44,+261]mm · 2 of 125 slices shown (4 of 5)]
[im 1/125]
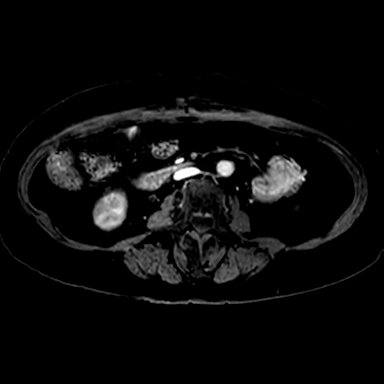
[im 125/125]
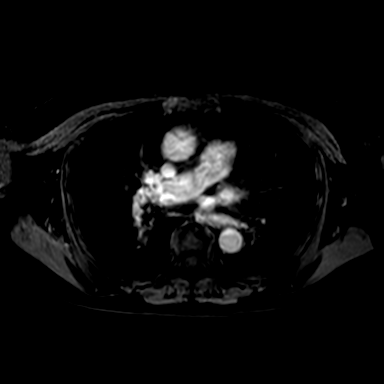

[Series 1306: DIXON · axial · 3.5mm · 0.86mm/px · z∈[+44,+261]mm · 2 of 125 slices shown (5 of 5)]
[im 1/125]
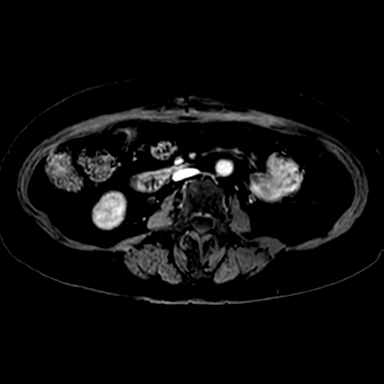
[im 125/125]
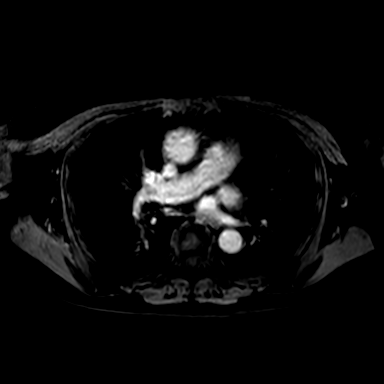

[Series 1307: DIXON post-contrast · axial · 3.5mm · 0.86mm/px · z∈[+44,+261]mm · 2 of 125 slices shown (1 of 4)]
[im 1/125]
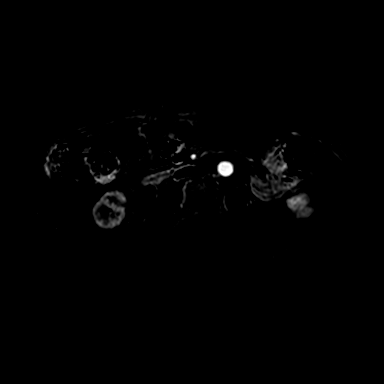
[im 125/125]
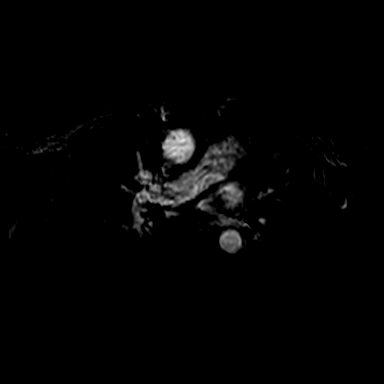

[Series 1308: DIXON post-contrast · axial · 3.5mm · 0.86mm/px · z∈[+44,+261]mm · 2 of 125 slices shown (2 of 4)]
[im 1/125]
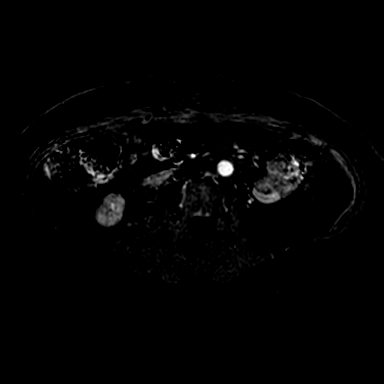
[im 125/125]
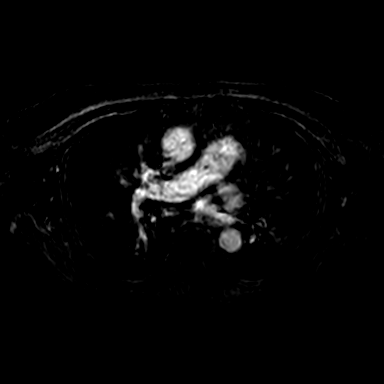

[Series 1309: DIXON post-contrast · axial · 3.5mm · 0.86mm/px · z∈[+44,+261]mm · 2 of 125 slices shown (3 of 4)]
[im 1/125]
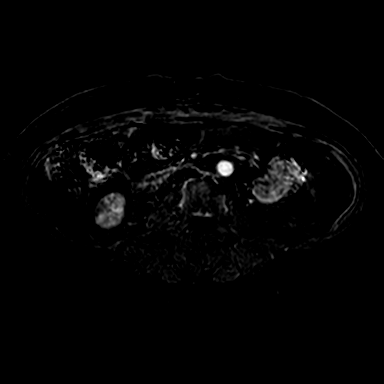
[im 125/125]
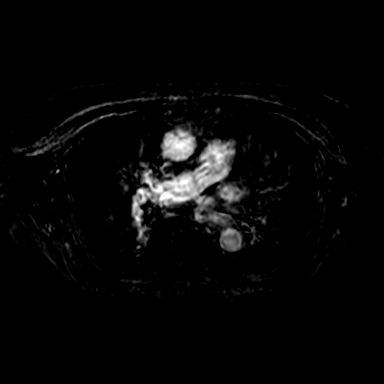

[Series 1310: DIXON post-contrast · axial · 3.5mm · 0.86mm/px · z∈[+44,+261]mm · 2 of 125 slices shown (4 of 4)]
[im 1/125]
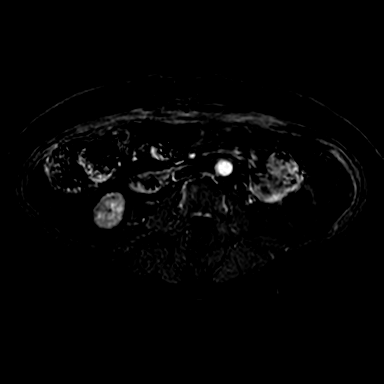
[im 125/125]
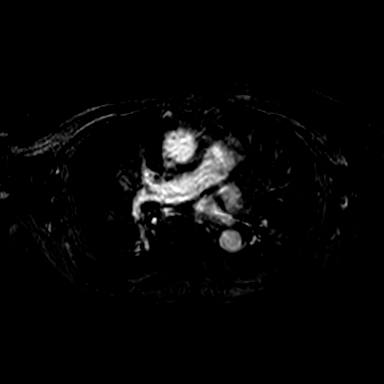

[Series 1501: T1 · coronal · 5.0mm · 0.75mm/px · 1 of 36 slices shown]
[im 1/36]
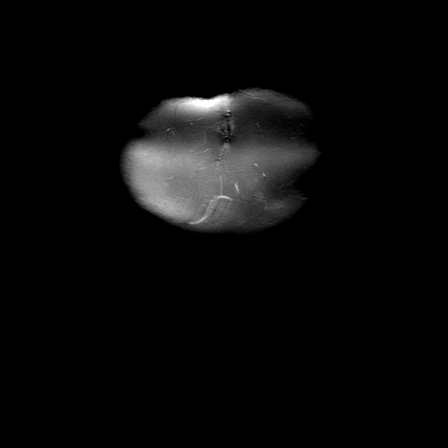

[32 of 48 positions shown; findings below may reference images not displayed]

FINDINGS: Status post left hepatectomy. No new hepatic masses. Fatty dictation. 
Status post cholecystectomy. No intrahepatic biliary dilatation. Mild prominence 
related to cholecystectomy and appears stable compared to the previous 
Kidneys demonstrate small cysts but are without evidence of focal mass or 
hydronephrosis. 
Spleen is without focal mass. 
Abdominal aorta and IVC are normal in caliber. 
No para-aortic or pelvic adenopathy. Trace effusions. 
Hernia anterior abdominal wall right lower abdomen containing fat. Bowel is 
normal in caliber.
IMPRESSION: No evidence of hepatic metastasis, lymphadenopathy or other MR evidence of 
recurrent malignancy in the abdomen.

## 2022-03-28 IMAGING — DX CHEST PA AND LATERAL
1 series · 2 of 2 positions shown · non-contrast
Comparison: none

________________________________________________________________________________________________ 
CLINICAL INDICATION:  Cough x3 months
TECHNIQUE: PA and lateral radiographs of the chest are compared to chest 
radiograph July 22, 2011.

[Series 1: PA · U · 0.14mm/px · 2 of 2 slices shown]
[im 1/2]
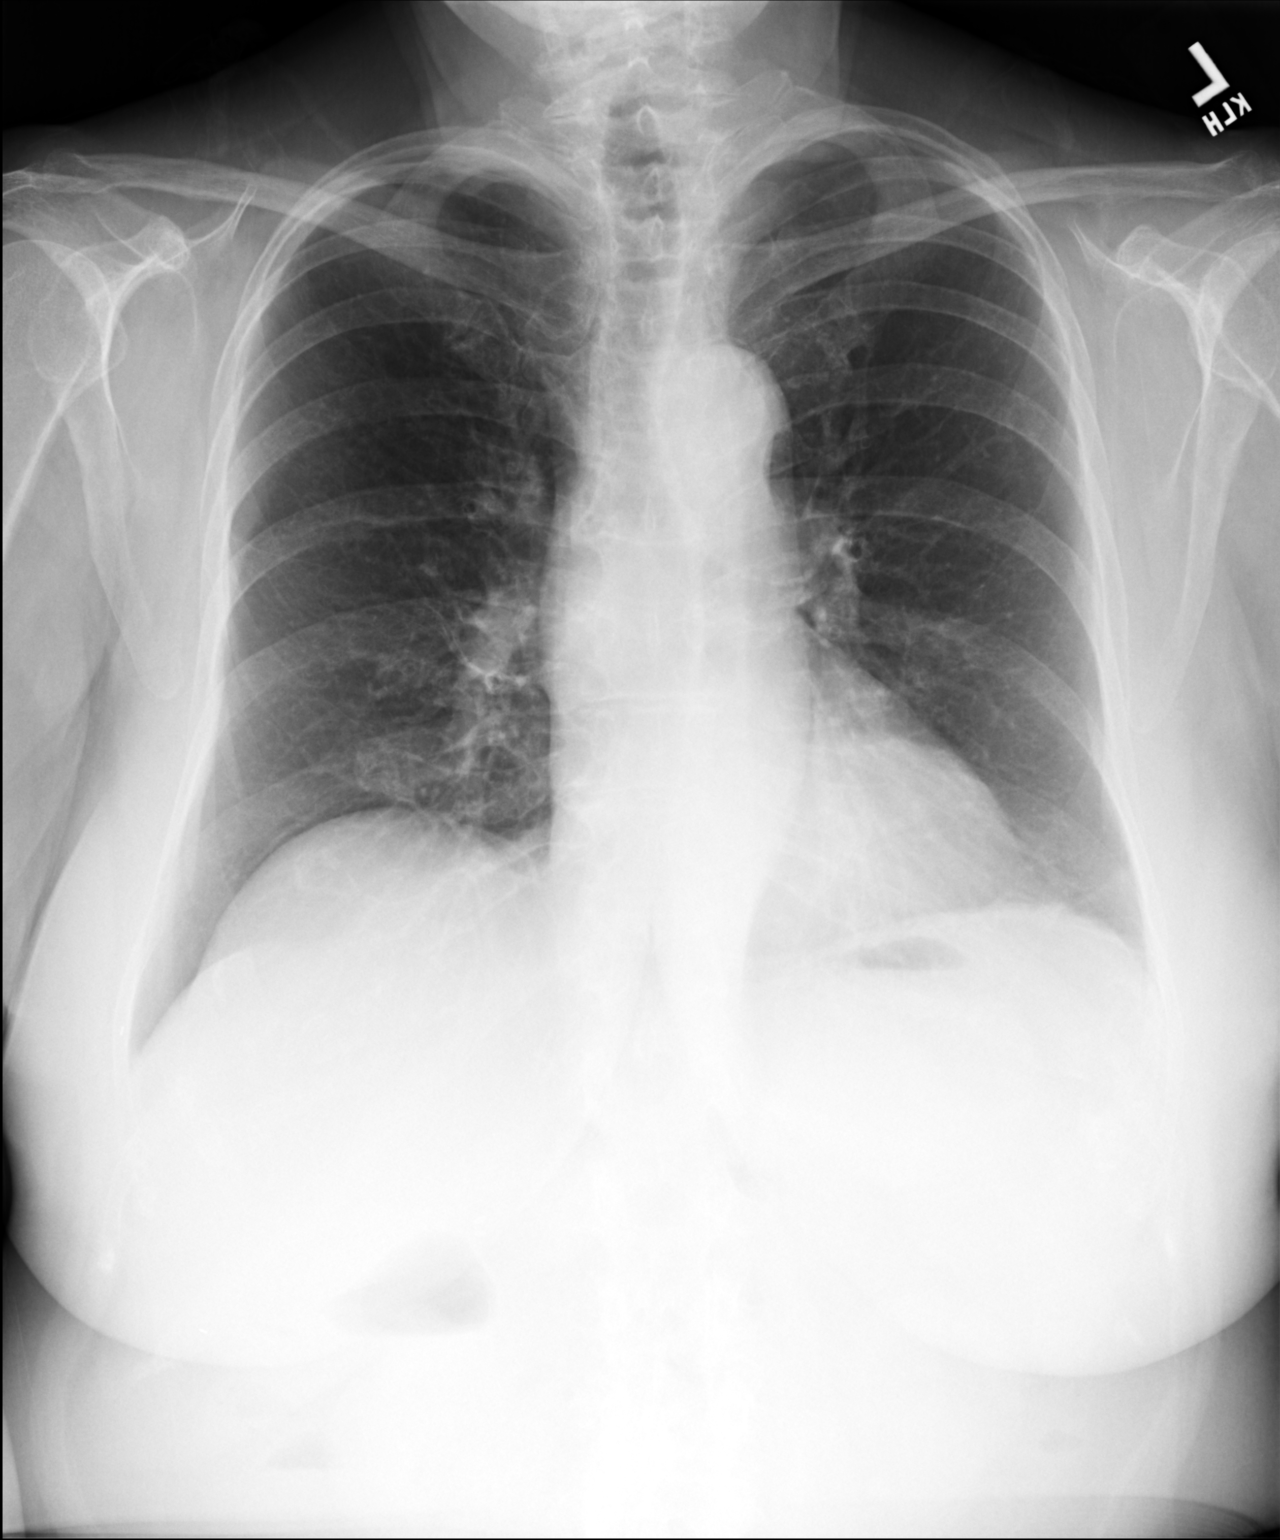
[im 2/2]
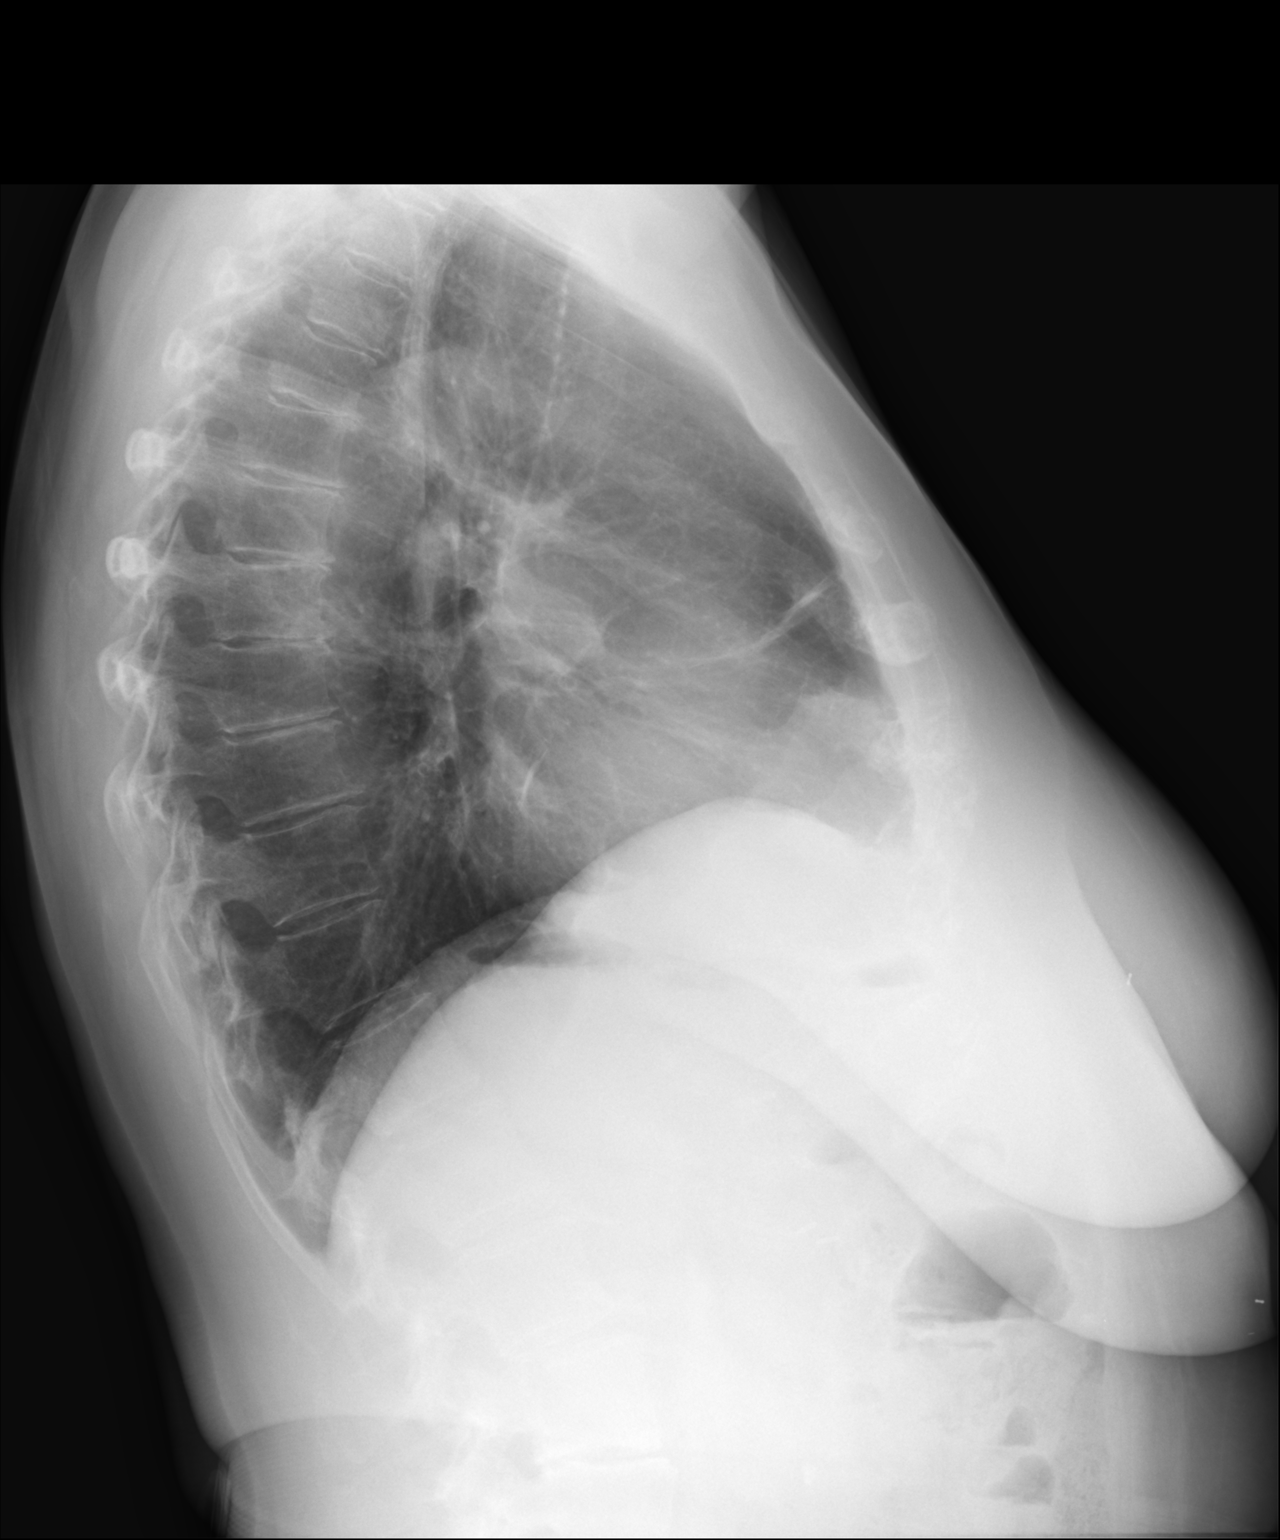

[2 of 2 positions shown; findings below may reference images not displayed]

FINDINGS: Lungs are well-expanded. There is partial eventration of the right 
hemidiaphragm. There is no infiltrate or effusion. Minor atelectasis at the left 
base. There appears to be mild atelectasis in the lingula. The heart is not 
enlarged.
IMPRESSION: No active cardiopulmonary findings. If indicated chest CT would be useful for 
further evaluation.

## 2022-04-12 IMAGING — CT CT CHEST WITH CONTRAST
2 of 4 series · 15 of 36 positions shown, 18 images · IV contrast (isovue)
Comparison: CT scan 11/15/2021

________________________________________________________________________________________________ 
CT CHEST WITH CONTRAST, 04/12/2022 [DATE]: 
CLINICAL INDICATION: History of rectal neoplasm. Cough. 
A search for DICOM formatted images was conducted for prior CT imaging studies 
completed at a non-affiliated media free facility.
TECHNIQUE: The chest was scanned from base of neck through the lung bases with 
100 mL of  Isovue 300 injected intravenously on a high resolution low dose CT 
scanner. 0 mL of Isovue 300 were discarded. Routine MPR and MIP 3D renderings 
were performed with concurrent physician supervision. The patients eGFR was 
calculated to be 71.5 mL/min/1.73 m2 using the i-STAT device.

[Series 5: chest 2.0 i31s 3 · axial · 0.68mm/px · z∈[-314,-38]mm · 12 of 154 slices shown, 15 images]
[im 8/154  mediastinal]
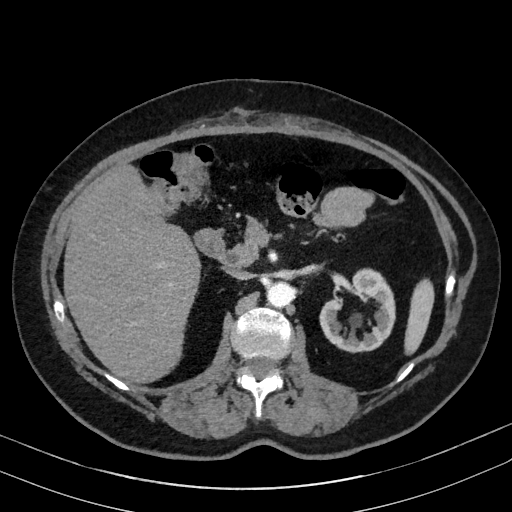
[im 8/154  lung]
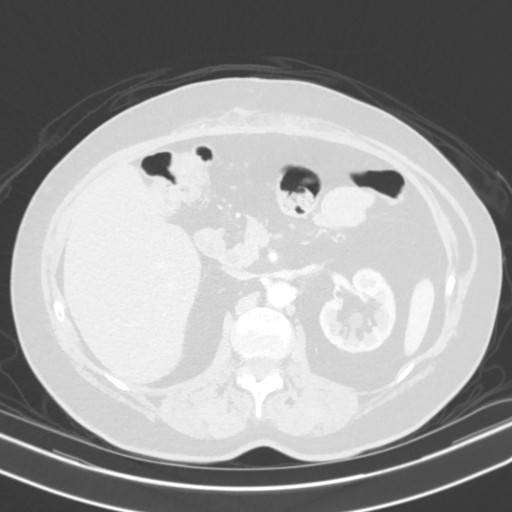
[im 22/154  lung]
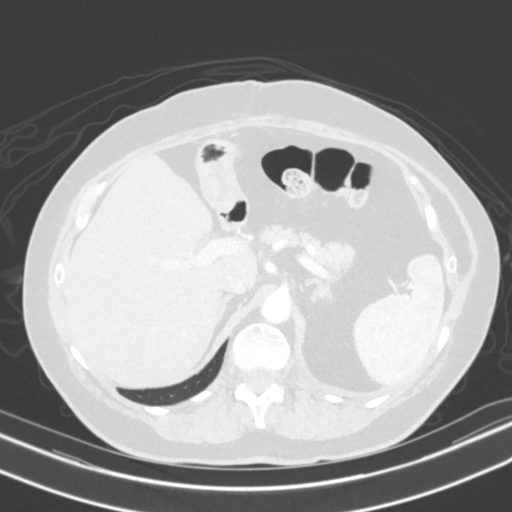
[im 37/154  lung]
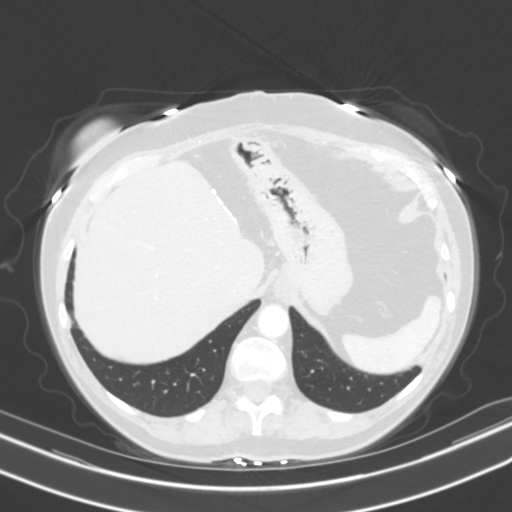
[im 44/154  lung]
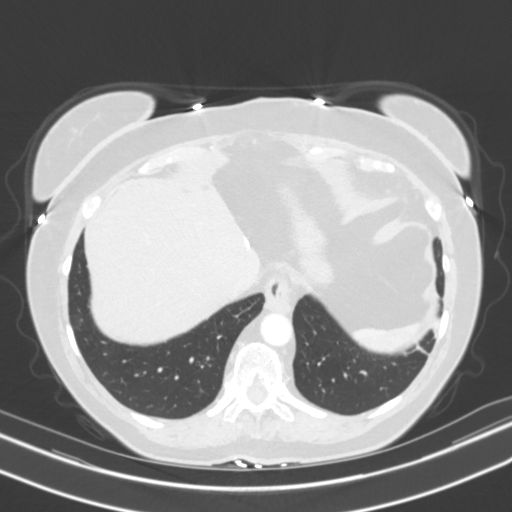
[im 59/154  mediastinal]
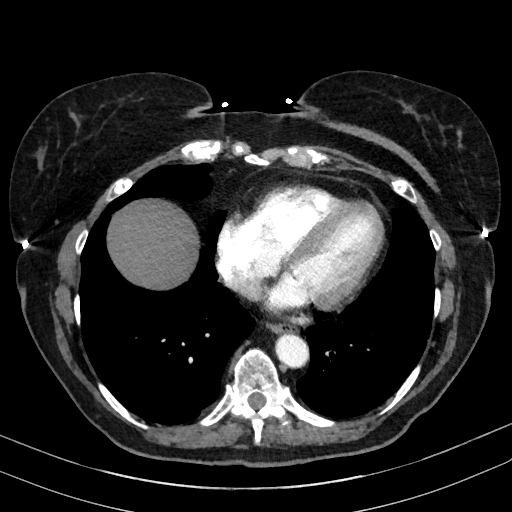
[im 59/154  lung]
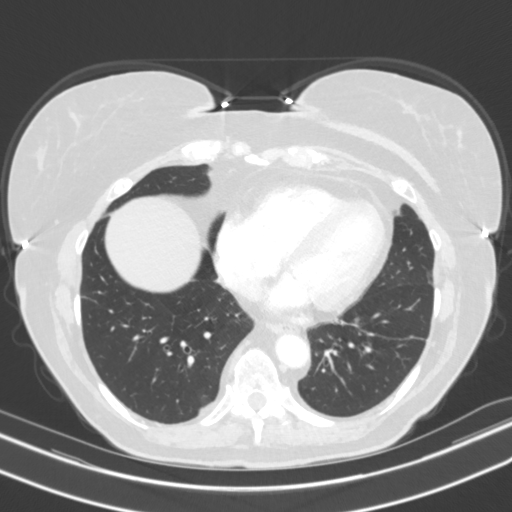
[im 73/154  lung]
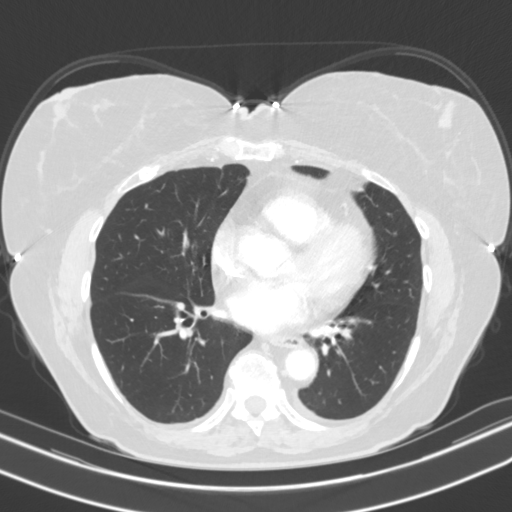
[im 81/154  lung]
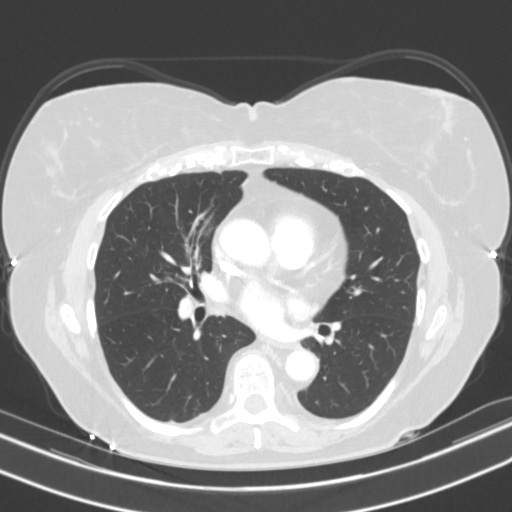
[im 95/154  lung]
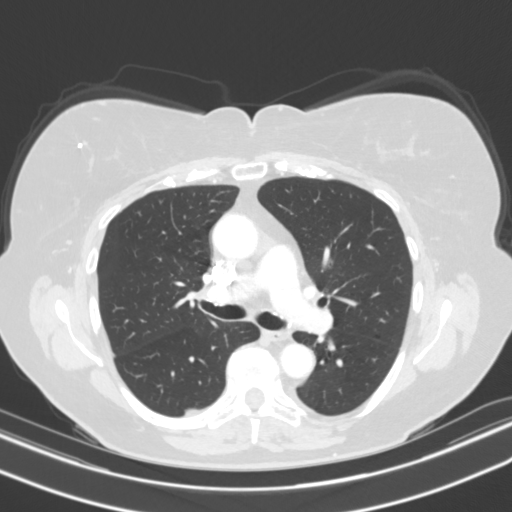
[im 110/154  mediastinal]
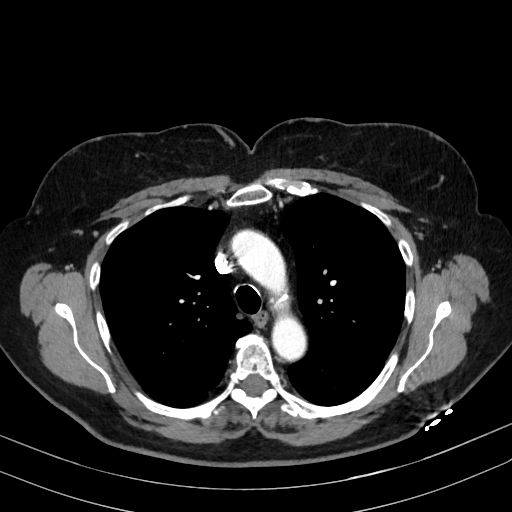
[im 110/154  lung]
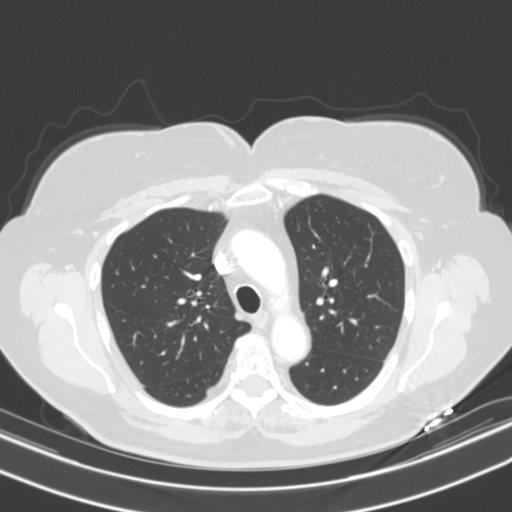
[im 117/154  lung]
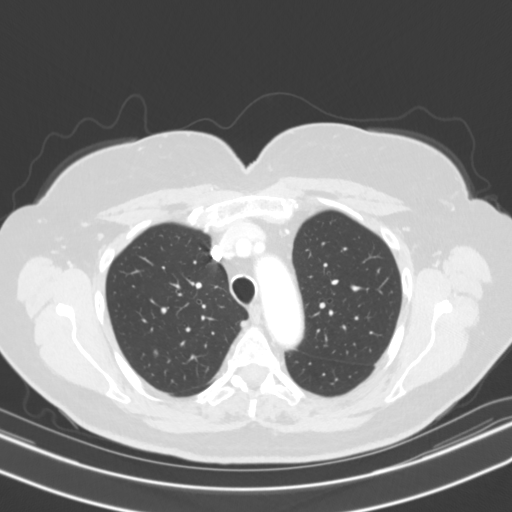
[im 132/154  lung]
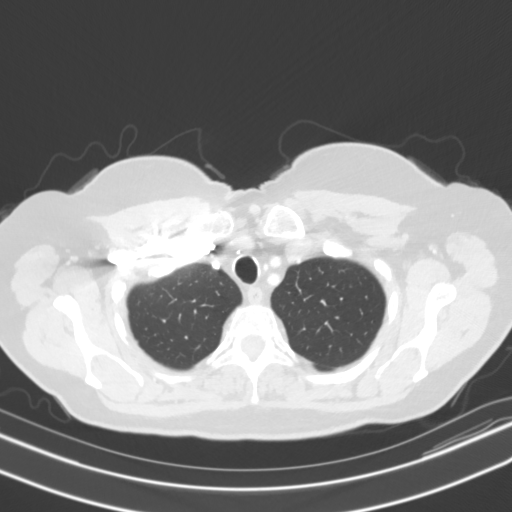
[im 146/154  lung]
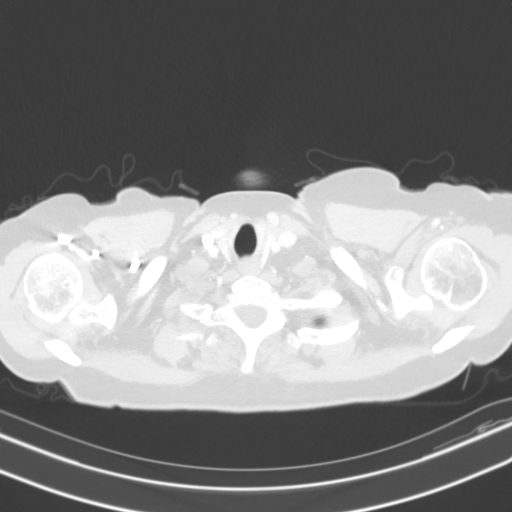

[Series 7: coronal · coronal · 0.61mm/px · 3 of 139 slices shown]
[im 28/139  lung]
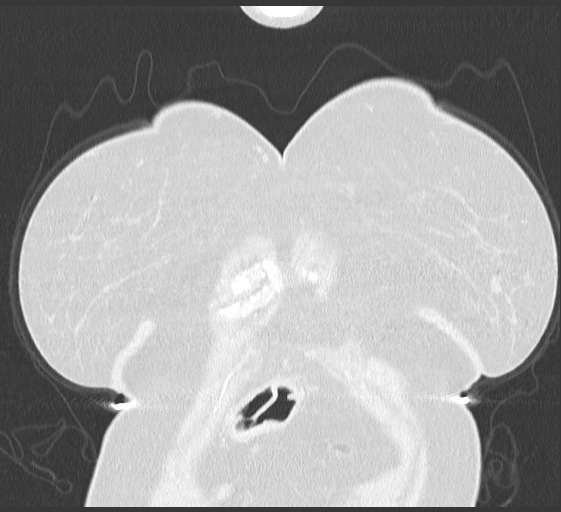
[im 56/139  lung]
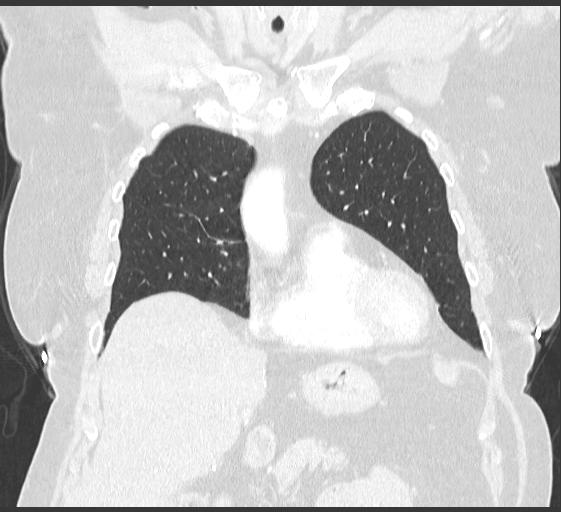
[im 83/139  lung]
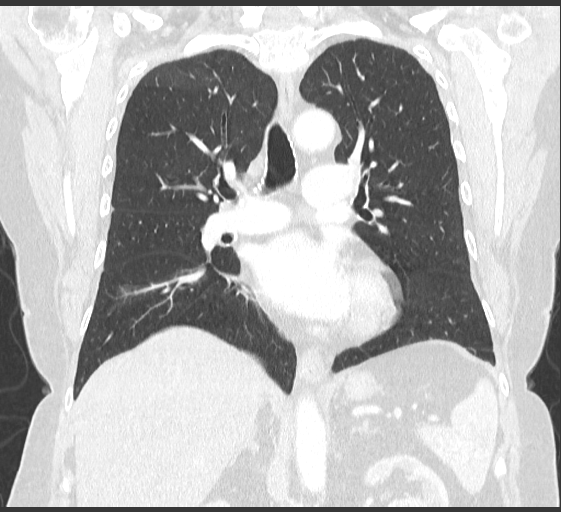

[15 of 36 positions shown; findings below may reference images not displayed]

FINDINGS: LUNGS AND PLEURA:  Subtle nodular densities are seen subpleural dependent 
portion of the lungs. These are actually improved on the left and increased on 
the right suggesting this more likely an inflammatory or atelectatic process. 
Stable scarring anteriorly in the right upper lobe. No new mass identified. 
Stable linear atelectasis right lower lobe. No pleural effusion. 
MEDIASTINUM:  No adenopathy. Normal heart size. No pericardial effusion. Mild 
coronary calcifications. 
CHEST WALL/AXILLA: No mass or adenopathy.  
UPPER ABDOMEN: Postoperative changes seen involving the left lobe the liver. 
MUSCULOSKELETAL: No acute abnormality.
IMPRESSION: Shifting suspected atelectatic changes in lung bases. Stable chronic changes 
elsewhere. No new abnormality or adenopathy seen. No acute abnormality seen to 
explain the patients cough. 
RADIATION DOSE REDUCTION: All CT scans are performed using radiation dose 
reduction techniques, when applicable.  Technical factors are evaluated and 
adjusted to ensure appropriate moderation of exposure.  Automated dose 
management technology is applied to adjust the radiation doses to minimize 
exposure while achieving diagnostic quality images.

## 2022-04-12 IMAGING — CT CT ABDOMEN AND PELVIS WITH CONTRAST
2 of 3 series · 16 of 46 positions shown, 18 images · IV contrast (isovue)
Comparison: CT scan 11/15/2021

________________________________________________________________________________________________ 
CT ABDOMEN AND PELVIS WITH CONTRAST, 04/12/2022 [DATE]: 
A search for DICOM formatted images was conducted for prior CT imaging studies 
completed at a non-affiliated media free facility.   
CLINICAL INDICATION: Cough. Epigastric discomfort status post hernia surgery 
02/04/2022
TECHNIQUE: The abdomen and pelvis was scanned from lung bases through the pubic 
rami with 100 mL of Isovue 300 MDV on a high-resolution CT scanner using dose 
reduction techniques.  Routine MPR reconstructions were performed. .

[Series 11: abd/pel with 3.0 i41s 2 · axial · 0.68mm/px · z∈[-595,-217]mm · 13 of 146 slices shown, 15 images]
[im 10/146  soft-tissue]
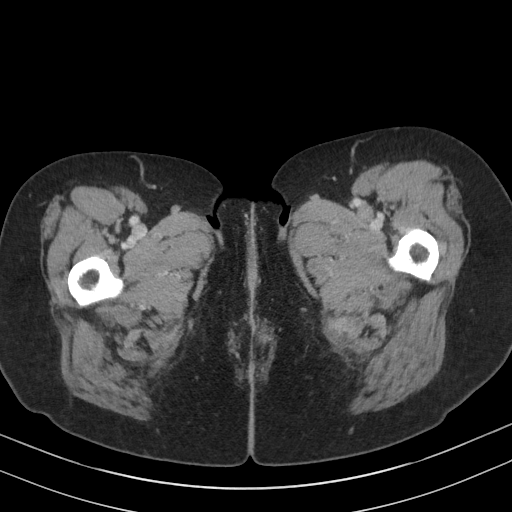
[im 10/146  bone]
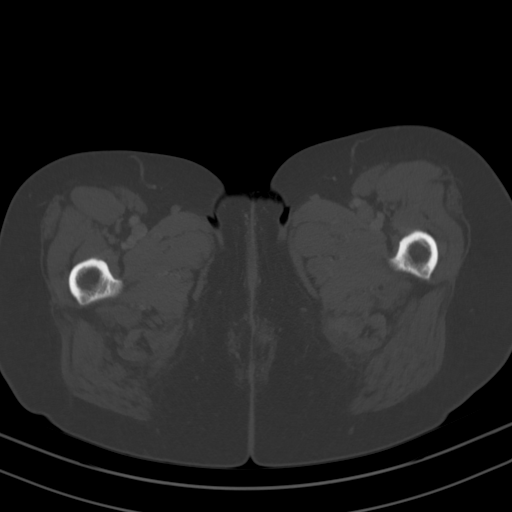
[im 19/146  soft-tissue]
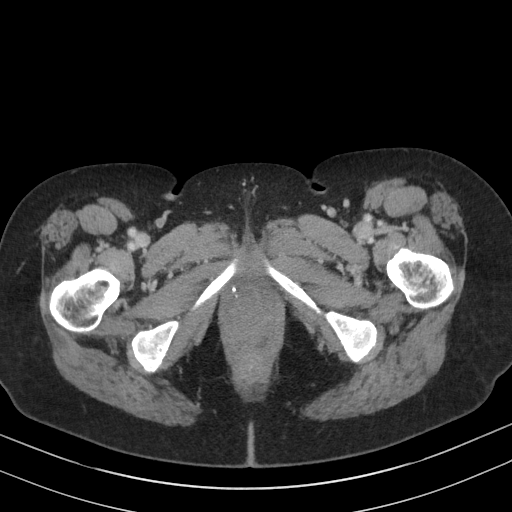
[im 29/146  soft-tissue]
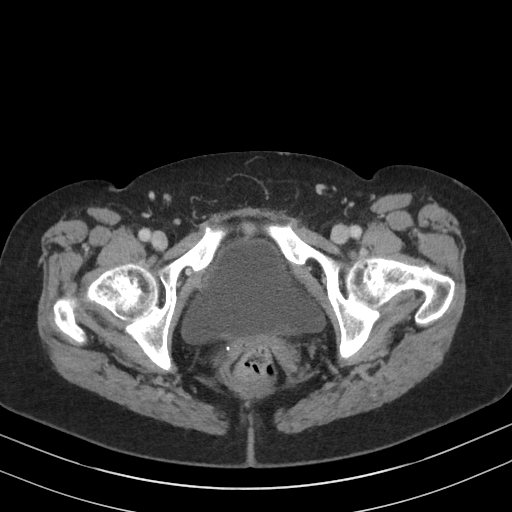
[im 43/146  soft-tissue]
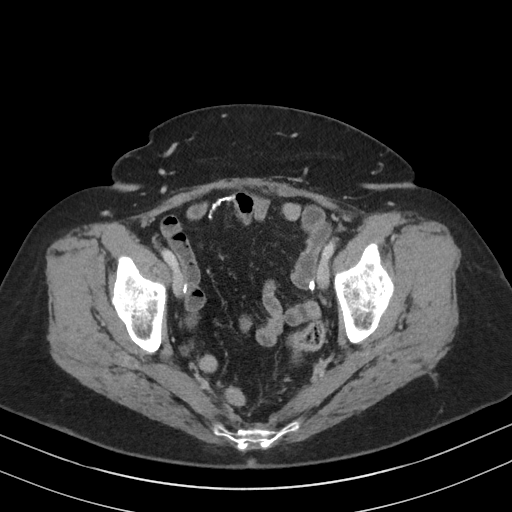
[im 52/146  soft-tissue]
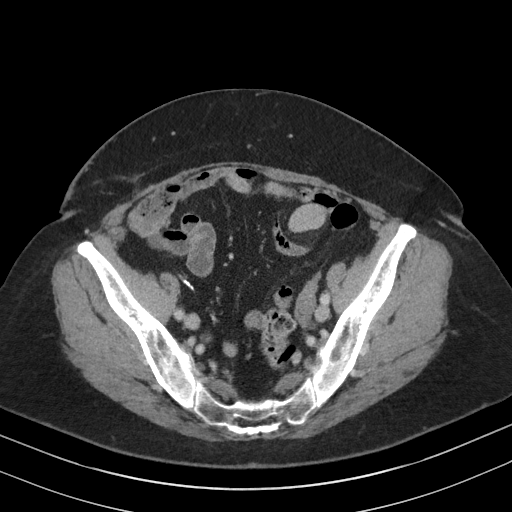
[im 61/146  soft-tissue]
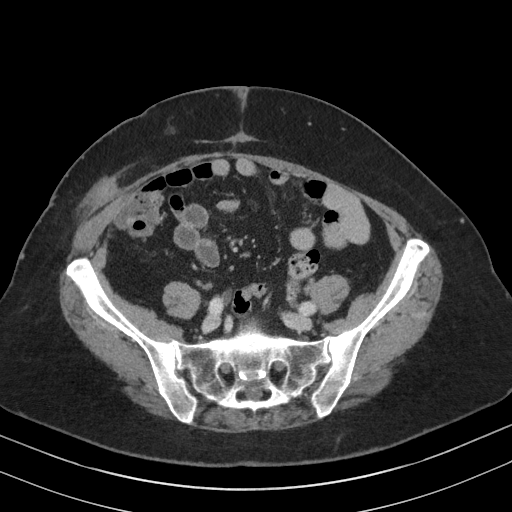
[im 75/146  soft-tissue]
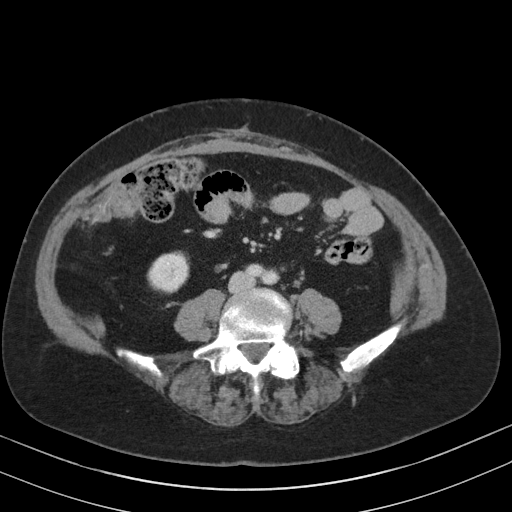
[im 85/146  soft-tissue]
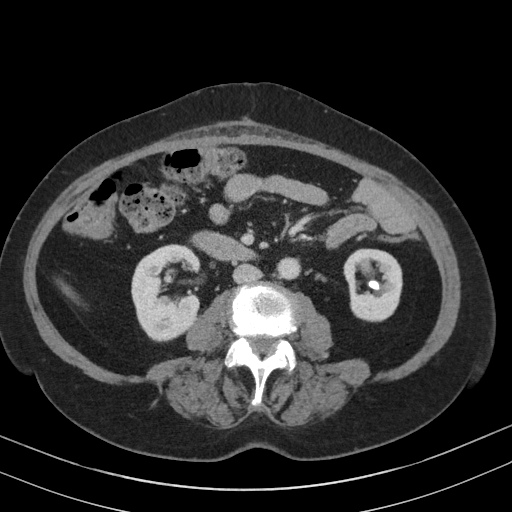
[im 94/146  soft-tissue]
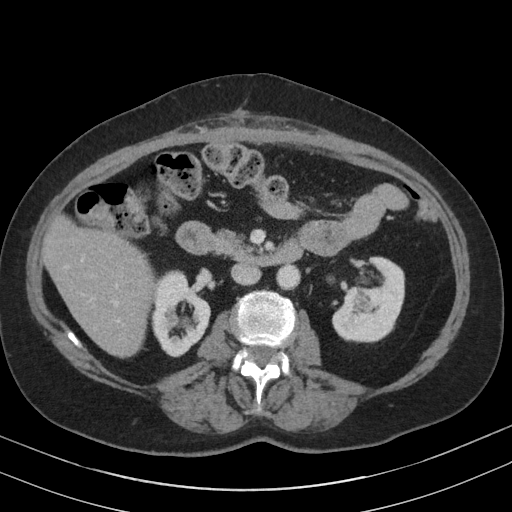
[im 94/146  bone]
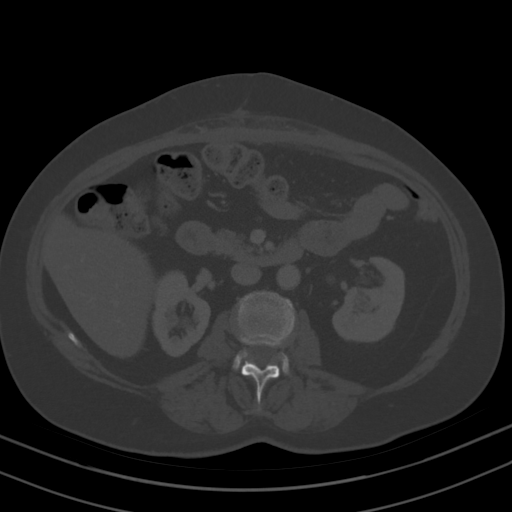
[im 103/146  soft-tissue]
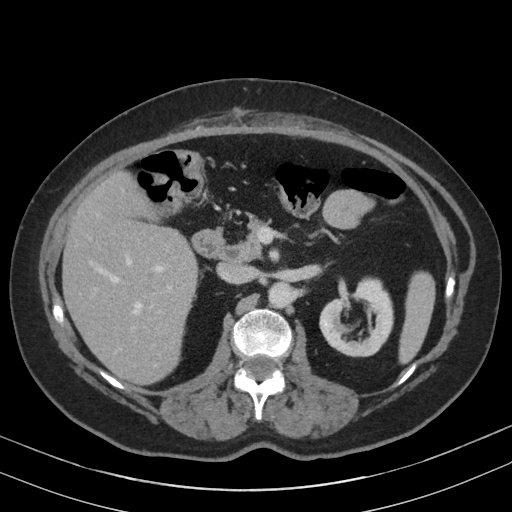
[im 117/146  soft-tissue]
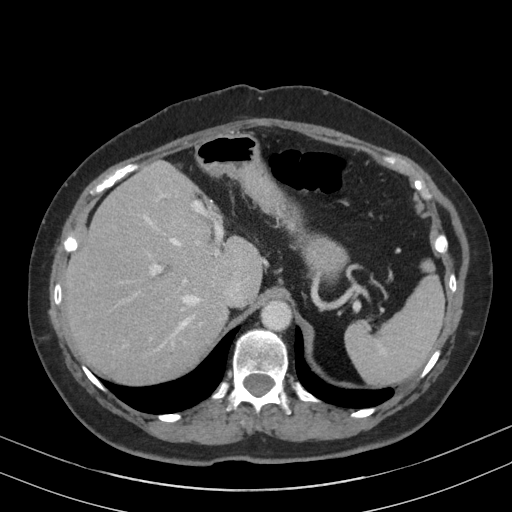
[im 127/146  soft-tissue]
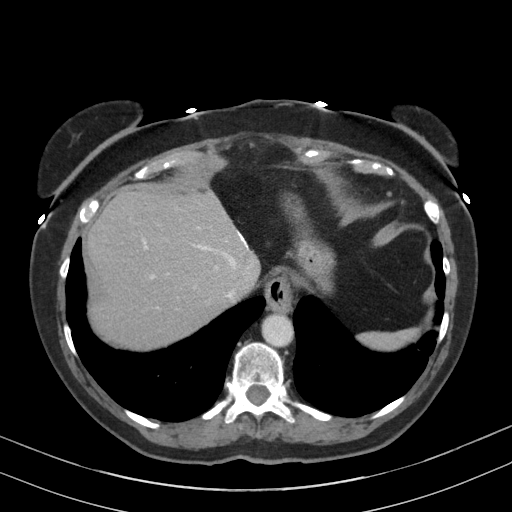
[im 136/146  soft-tissue]
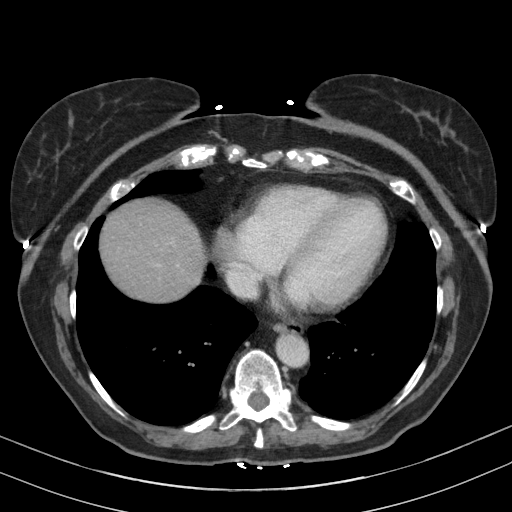

[Series 13: cor from thins · coronal · 0.72mm/px · 3 of 141 slices shown]
[im 47/141  soft-tissue]
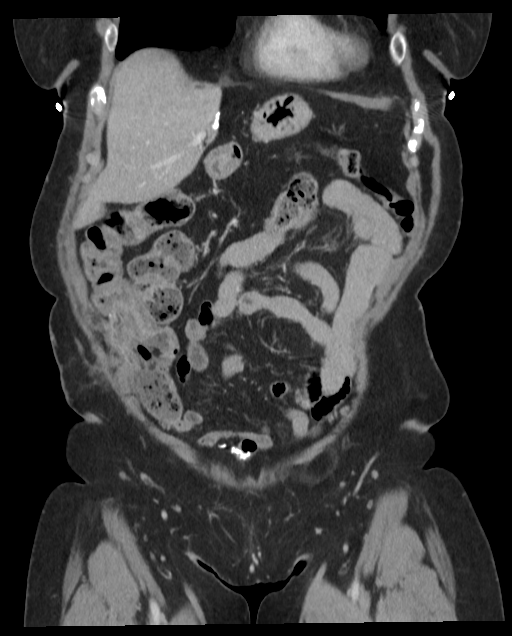
[im 63/141  soft-tissue]
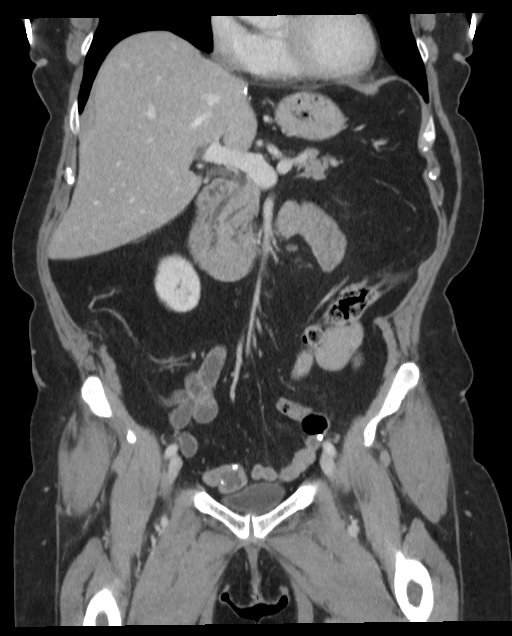
[im 78/141  soft-tissue]
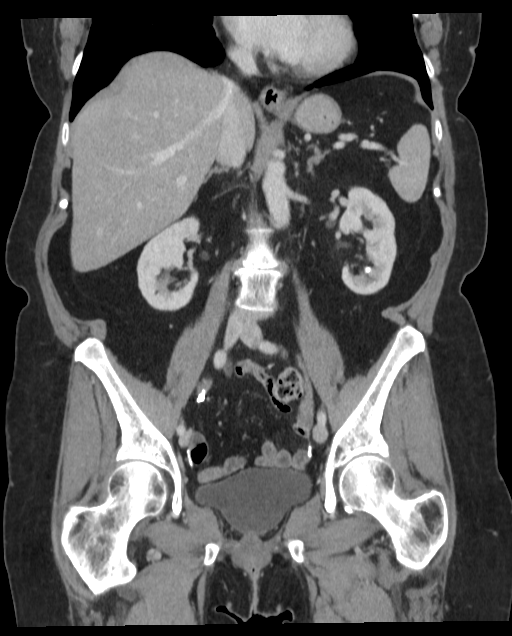

[16 of 46 positions shown; findings below may reference images not displayed]

FINDINGS: LUNG BASES: Please refer to CT the chest for details in this region which was 
also obtained today. 
HEPATOBILIARY: Postoperative changes. No mass identified. Previous 
cholecystectomy. 
SPLEEN: Normal in size. 
PANCREAS: No ductal dilatation or mass.   
ADRENALS: No mass. 
GENITOURINARY: No enhancing mass or hydronephrosis.  Nonobstructing 8 mm 
calculus inferior pole left kidney. Mean Hounsfield unit 5132. Mild bladder 
prolapse into the upper vagina. No mass identified. 
LYMPH NODES: No adenopathy. 
STOMACH, SMALL BOWEL AND COLON: Extensive postoperative changes identified 
involving the colon and small bowel. No obstructive changes.. 
VASCULAR STRUCTURES: Mild atherosclerotic changes.  
MUSCULOSKELETAL: Degenerative changes without fracture or destructive changes 
seen.  
ADDITIONAL FINDINGS: There are postoperative changes right lateral abdominal 
wall. There has been repair of the hernia previously seen in this region back in 
October 2021. There is a mesh identified deep to the musculature. There is soft 
tissue within the subcutaneous fat do not see drainable fluid collection. This 
could all simply be postoperative changes.
IMPRESSION: Postoperative changes right lower quadrant at the area of hernia repair. No 
abscess seen at this time. Please see discussion above. 
Degenerative changes and postoperative changes seen within the bowel. 
Nonobstructing left renal calculus. 
RADIATION DOSE REDUCTION: All CT scans are performed using radiation dose 
reduction techniques, when applicable.  Technical factors are evaluated and 
adjusted to ensure appropriate moderation of exposure.  Automated dose 
management technology is applied to adjust the radiation doses to minimize 
exposure while achieving diagnostic quality images.
